# Patient Record
Sex: Male | Born: 1950 | Race: White | Hispanic: No | Marital: Married | Smoking: Former smoker
Health system: Southern US, Community
[De-identification: ages and names within clinical notes are randomized; demographics above are authoritative.]

## PROBLEM LIST (undated history)

## (undated) DIAGNOSIS — M1990 Unspecified osteoarthritis, unspecified site: Secondary | ICD-10-CM

## (undated) DIAGNOSIS — I10 Essential (primary) hypertension: Secondary | ICD-10-CM

## (undated) DIAGNOSIS — E119 Type 2 diabetes mellitus without complications: Secondary | ICD-10-CM

## (undated) DIAGNOSIS — H269 Unspecified cataract: Secondary | ICD-10-CM

## (undated) DIAGNOSIS — E785 Hyperlipidemia, unspecified: Secondary | ICD-10-CM

## (undated) DIAGNOSIS — T7840XA Allergy, unspecified, initial encounter: Secondary | ICD-10-CM

## (undated) DIAGNOSIS — C801 Malignant (primary) neoplasm, unspecified: Secondary | ICD-10-CM

## (undated) HISTORY — DX: Hyperlipidemia, unspecified: E78.5

## (undated) HISTORY — DX: Malignant (primary) neoplasm, unspecified: C80.1

## (undated) HISTORY — DX: Unspecified cataract: H26.9

## (undated) HISTORY — DX: Unspecified osteoarthritis, unspecified site: M19.90

## (undated) HISTORY — DX: Essential (primary) hypertension: I10

## (undated) HISTORY — DX: Type 2 diabetes mellitus without complications: E11.9

## (undated) HISTORY — DX: Allergy, unspecified, initial encounter: T78.40XA

---

## 1973-03-05 HISTORY — PX: OTHER SURGICAL HISTORY: SHX169

## 2009-11-26 ENCOUNTER — Ambulatory Visit: Payer: Self-pay | Admitting: Diagnostic Radiology

## 2009-11-26 ENCOUNTER — Ambulatory Visit (HOSPITAL_BASED_OUTPATIENT_CLINIC_OR_DEPARTMENT_OTHER): Admission: RE | Admit: 2009-11-26 | Discharge: 2009-11-26 | Payer: Self-pay | Admitting: Family Medicine

## 2012-08-04 ENCOUNTER — Encounter: Payer: Self-pay | Admitting: Gastroenterology

## 2012-09-19 ENCOUNTER — Encounter: Payer: Self-pay | Admitting: Gastroenterology

## 2012-10-24 ENCOUNTER — Encounter: Payer: Self-pay | Admitting: Gastroenterology

## 2012-11-05 ENCOUNTER — Encounter: Payer: Self-pay | Admitting: Gastroenterology

## 2012-12-15 ENCOUNTER — Ambulatory Visit (AMBULATORY_SURGERY_CENTER): Payer: Self-pay | Admitting: *Deleted

## 2012-12-15 VITALS — Ht 68.0 in | Wt 221.0 lb

## 2012-12-15 DIAGNOSIS — Z1211 Encounter for screening for malignant neoplasm of colon: Secondary | ICD-10-CM

## 2012-12-15 NOTE — Progress Notes (Signed)
No allergies to eggs or soy. No problems with anesthesia.  

## 2012-12-17 ENCOUNTER — Encounter: Payer: Self-pay | Admitting: Gastroenterology

## 2012-12-19 ENCOUNTER — Ambulatory Visit (AMBULATORY_SURGERY_CENTER): Payer: BC Managed Care – PPO | Admitting: Gastroenterology

## 2012-12-19 ENCOUNTER — Encounter: Payer: Self-pay | Admitting: Gastroenterology

## 2012-12-19 VITALS — BP 139/85 | HR 69 | Temp 97.9°F | Resp 21 | Ht 68.0 in | Wt 221.0 lb

## 2012-12-19 DIAGNOSIS — D126 Benign neoplasm of colon, unspecified: Secondary | ICD-10-CM

## 2012-12-19 DIAGNOSIS — Z1211 Encounter for screening for malignant neoplasm of colon: Secondary | ICD-10-CM

## 2012-12-19 MED ORDER — SODIUM CHLORIDE 0.9 % IV SOLN: 500.0000 mL | INTRAVENOUS | Status: DC

## 2012-12-19 NOTE — Progress Notes (Signed)
Procedure ends, to recovery, report given and VSS. 

## 2012-12-19 NOTE — Progress Notes (Signed)
Patient did not have preoperative order for IV antibiotic SSI prophylaxis. (G8918)  Patient did not experience any of the following events: a burn prior to discharge; a fall within the facility; wrong site/side/patient/procedure/implant event; or a hospital transfer or hospital admission upon discharge from the facility. (G8907)  

## 2012-12-19 NOTE — Op Note (Signed)
[facility] [address] [place], [address]   COLONOSCOPY PROCEDURE REPORT PATIENT: Ricardo Hahn, Ricardo Hahn  MR#: [id number] BIRTHDATE: [birth year] , 62  yrs. old GENDER: Male ENDOSCOPIST: Meryl Dare, MD, [facility] PROCEDURE DATE:  12/19/2012 PROCEDURE:   Colonoscopy with snare polypectomy First Screening Colonoscopy - Avg.  risk and is 50 yrs.  old or older Yes.  Prior Negative Screening - Now for repeat screening. N/A  History of Adenoma - Now for follow-up colonoscopy & has been > or = to 3 yrs.  N/A  Polyps Removed Today? Yes. ASA CLASS:   Class II INDICATIONS:average risk screening. MEDICATIONS: MAC sedation, administered by CRNA and propofol (Diprivan) 250mg  IV DESCRIPTION OF PROCEDURE:   After the risks benefits and alternatives of the procedure were thoroughly explained, informed consent was obtained.  A digital rectal exam revealed no abnormalities of the rectum.   The LB [id number]  endoscope was introduced through the anus and advanced to the cecum, which was identified by both the appendix and ileocecal valve. No adverse events experienced.   The quality of the prep was good, using MoviPrep  The instrument was then slowly withdrawn as the colon was fully examined.  COLON FINDINGS: Three sessile polyps measuring 4, 5, 7 mm in size were found in the ascending colon.  A polypectomy was performed with a cold snare.  The resection was complete and the polyp tissue was completely retrieved.   A sessile polyp measuring 1 cm in size was found in the transverse colon.  A polypectomy was performed using snare cautery.  The resection was complete and the polyp tissue was completely retrieved. Moderate diverticulosis was noted in the descending colon and sigmoid colon.   The colon was otherwise normal.  There was no diverticulosis, inflammation, polyps or cancers unless previously stated.  Retroflexed views revealed no abnormalities. The time to cecum=1 minutes  45 seconds. Withdrawal time=13 minutes 15 seconds.  The scope was withdrawn and the procedure completed. COMPLICATIONS: There were no complications. ENDOSCOPIC IMPRESSION: 1.   Three sessile polyps measuring 4, 5, 7 mm in the ascending colon; polypectomy performed with a cold snare 2.   Sessile polyp measuring 1 cm in the transverse colon; polypectomy performed using snare cautery 3.   Moderate diverticulosis was noted in the descending colon and sigmoid colon  RECOMMENDATIONS: 1.  Hold aspirin, aspirin products, and anti-inflammatory medication for 2 weeks. 2.  Await pathology results 3.  Repeat colonoscopy in 3 years if largest polyp adenomatous or 3 or more adenomatous; 5 years if 1-2 adenomatous; otherwise 10 years 4.  High fiber diet with liberal fluid intake.  eSigned:  Meryl Dare, MD, [facility] 12/19/2012 8:28 AM

## 2012-12-19 NOTE — Progress Notes (Signed)
Called to room to assist during endoscopic procedure.  Patient ID and intended procedure confirmed with present staff. Received instructions for my participation in the procedure from the performing physician.  

## 2012-12-19 NOTE — Patient Instructions (Signed)
Impressions/recommendations:  Polyps (handout given) Diverticulosis (handout given)  High Fiber diet (handout given)  Hold aspirin, aspirin containing products and anti-inflammatory medicines for two weeks. May resume November 1.  Repeat colonoscopy pending pathology results.  YOU HAD AN ENDOSCOPIC PROCEDURE TODAY AT THE [facility]: Refer to the procedure report that was given to you for any specific questions about what was found during the examination.  If the procedure report does not answer your questions, please call your gastroenterologist to clarify.  If you requested that your care partner not be given the details of your procedure findings, then the procedure report has been included in a sealed envelope for you to review at your convenience later.  YOU SHOULD EXPECT: Some feelings of bloating in the abdomen. Passage of more gas than usual.  Walking can help get rid of the air that was put into your GI tract during the procedure and reduce the bloating. If you had a lower endoscopy (such as a colonoscopy or flexible sigmoidoscopy) you may notice spotting of blood in your stool or on the toilet paper. If you underwent a bowel prep for your procedure, then you may not have a normal bowel movement for a few days.  DIET: Your first meal following the procedure should be a light meal and then it is ok to progress to your normal diet.  A half-sandwich or bowl of soup is an example of a good first meal.  Heavy or fried foods are harder to digest and may make you feel nauseous or bloated.  Likewise meals heavy in dairy and vegetables can cause extra gas to form and this can also increase the bloating.  Drink plenty of fluids but you should avoid alcoholic beverages for 24 hours.  ACTIVITY: Your care partner should take you home directly after the procedure.  You should plan to take it easy, moving slowly for the rest of the day.  You can resume normal activity the day after the  procedure however you should NOT DRIVE or use heavy machinery for 24 hours (because of the sedation medicines used during the test).    SYMPTOMS TO REPORT IMMEDIATELY: A gastroenterologist can be reached at any hour.  During normal business hours, 8:30 AM to 5:00 PM Monday through Friday, call [phone].  After hours and on weekends, please call the GI answering service at [phone] who will take a message and have the physician on call contact you.   Following lower endoscopy (colonoscopy or flexible sigmoidoscopy):  Excessive amounts of blood in the stool  Significant tenderness or worsening of abdominal pains  Swelling of the abdomen that is new, acute  Fever of 100F or higher   FOLLOW UP: If any biopsies were taken you will be contacted by phone or by letter within the next 1-3 weeks.  Call your gastroenterologist if you have not heard about the biopsies in 3 weeks.  Our staff will call the home number listed on your records the next business day following your procedure to check on you and address any questions or concerns that you may have at that time regarding the information given to you following your procedure. This is a courtesy call and so if there is no answer at the home number and we have not heard from you through the emergency physician on call, we will assume that you have returned to your regular daily activities without incident.  SIGNATURES/CONFIDENTIALITY: You and/or your care partner have signed paperwork which will  be entered into your electronic medical record.  These signatures attest to the fact that that the information above on your After Visit Summary has been reviewed and is understood.  Full responsibility of the confidentiality of this discharge information lies with you and/or your care-partner. 

## 2012-12-22 ENCOUNTER — Telehealth: Payer: Self-pay | Admitting: *Deleted

## 2012-12-22 NOTE — Telephone Encounter (Signed)
  Follow up Call-  Call back number 12/19/2012  Post procedure Call Back phone  # [phone]  Permission to leave phone message Yes    No answer and pt doesn't have a voicemail box set up yet

## 2012-12-23 ENCOUNTER — Encounter: Payer: Self-pay | Admitting: Gastroenterology

## 2016-03-14 ENCOUNTER — Other Ambulatory Visit: Payer: Self-pay

## 2016-03-14 DIAGNOSIS — Z86010 Personal history of colonic polyps: Secondary | ICD-10-CM

## 2016-03-30 ENCOUNTER — Ambulatory Visit (AMBULATORY_SURGERY_CENTER): Payer: BLUE CROSS/BLUE SHIELD | Admitting: Gastroenterology

## 2016-03-30 ENCOUNTER — Encounter: Payer: Self-pay | Admitting: Gastroenterology

## 2016-03-30 VITALS — BP 138/80 | HR 78 | Temp 98.1°F | Resp 19 | Wt 207.5 lb

## 2016-03-30 DIAGNOSIS — D125 Benign neoplasm of sigmoid colon: Secondary | ICD-10-CM | POA: Diagnosis not present

## 2016-03-30 DIAGNOSIS — Z800 Family history of malignant neoplasm of digestive organs: Secondary | ICD-10-CM

## 2016-03-30 DIAGNOSIS — Z86010 Personal history of colonic polyps: Secondary | ICD-10-CM | POA: Diagnosis not present

## 2016-03-30 DIAGNOSIS — D124 Benign neoplasm of descending colon: Secondary | ICD-10-CM | POA: Diagnosis not present

## 2016-03-30 DIAGNOSIS — K635 Polyp of colon: Secondary | ICD-10-CM

## 2016-03-30 DIAGNOSIS — D123 Benign neoplasm of transverse colon: Secondary | ICD-10-CM

## 2016-03-30 MED ORDER — SODIUM CHLORIDE 0.9 % IV SOLN: 500.0000 mL | INTRAVENOUS | Status: DC

## 2016-03-30 NOTE — Op Note (Addendum)
[facility] Patient Name: Ricardo Hahn Procedure Date: 03/30/2016 1:54 PM MRN: [id number] Endoscopist: Ladene Artist , MD Age: 66 Referring MD:  Date of Birth: 08/13/1950 Gender: Male Account #: [id number] Procedure:                Colonoscopy Indications:              Surveillance: Personal history of adenomatous                            polyps on last colonoscopy > 3 years ago, Screening                            patient at increased risk: Family history of                            colorectal cancer in multiple 1st-degree relatives Medicines:                Monitored Anesthesia Care Procedure:                Pre-Anesthesia Assessment:                           - Prior to the procedure, a History and Physical                            was performed, and patient medications and                            allergies were reviewed. The patient's tolerance of                            previous anesthesia was also reviewed. The risks                            and benefits of the procedure and the sedation                            options and risks were discussed with the patient.                            All questions were answered, and informed consent                            was obtained. Prior Anticoagulants: The patient has                            taken no previous anticoagulant or antiplatelet                            agents. ASA Grade Assessment: II - A patient with                            mild systemic disease. After reviewing the risks  and benefits, the patient was deemed in                            satisfactory condition to undergo the procedure.                           After obtaining informed consent, the colonoscope                            was passed under direct vision. Throughout the                            procedure, the patient's blood pressure, pulse, and                            oxygen  saturations were monitored continuously. The                            Model [id number]-[id number]) scope was introduced                            through the anus and advanced to the the cecum,                            identified by appendiceal orifice and ileocecal                            valve. The ileocecal valve, appendiceal orifice,                            and rectum were photographed. The quality of the                            bowel preparation was good. The colonoscopy was                            performed without difficulty. The patient tolerated                            the procedure well. Scope In: 2:04:30 PM Scope Out: 2:20:35 PM Scope Withdrawal Time: 0 hours 13 minutes 59 seconds  Total Procedure Duration: 0 hours 16 minutes 5 seconds  Findings:                 The perianal and digital rectal examinations were                            normal.                           Three sessile polyps were found in the descending                            colon, transverse colon and hepatic flexure. The  polyps were 6 to 8 mm in size. These polyps were                            removed with a cold snare. Resection and retrieval                            were complete.                           Two sessile polyps were found in the sigmoid colon                            and descending colon. The polyps were 5 mm in size.                            These polyps were removed with a cold biopsy                            forceps. Resection and retrieval were complete.                           Multiple medium-mouthed diverticula were found in                            the sigmoid colon and descending colon. There was                            narrowing of the colon in association with the                            diverticular opening. There was evidence of                            diverticular spasm. There was no evidence of                             diverticular bleeding.                           The exam was otherwise without abnormality on                            direct and retroflexion views. Complications:            No immediate complications. Estimated blood loss:                            None. Estimated Blood Loss:     Estimated blood loss: none. Impression:               - Three 6 to 8 mm polyps in the descending colon,                            in the transverse colon and at the hepatic flexure,  removed with a cold snare. Resected and retrieved.                           - Two 5 mm polyps in the sigmoid colon and in the                            descending colon, removed with a cold biopsy                            forceps. Resected and retrieved.                           - Moderate diverticulosis in the sigmoid colon and                            in the descending colon.                           - The examination was otherwise normal on direct                            and retroflexion views. Recommendation:           - Repeat colonoscopy in 3 years for surveillance.                           - Patient has a contact number available for                            emergencies. The signs and symptoms of potential                            delayed complications were discussed with the                            patient. Return to normal activities tomorrow.                            Written discharge instructions were provided to the                            patient.                           - High fiber diet.                           - Continue present medications.                           - Await pathology results. Ladene Artist, MD 03/30/2016 2:28:18 PM This report has been signed electronically.

## 2016-03-30 NOTE — Progress Notes (Signed)
Called to room to assist during endoscopic procedure.  Patient ID and intended procedure confirmed with present staff. Received instructions for my participation in the procedure from the performing physician.  

## 2016-03-30 NOTE — Patient Instructions (Signed)
YOU HAD AN ENDOSCOPIC PROCEDURE TODAY AT [facility]:   Refer to the procedure report that was given to you for any specific questions about what was found during the examination.  If the procedure report does not answer your questions, please call your gastroenterologist to clarify.  If you requested that your care partner not be given the details of your procedure findings, then the procedure report has been included in a sealed envelope for you to review at your convenience later.  YOU SHOULD EXPECT: Some feelings of bloating in the abdomen. Passage of more gas than usual.  Walking can help get rid of the air that was put into your GI tract during the procedure and reduce the bloating. If you had a lower endoscopy (such as a colonoscopy or flexible sigmoidoscopy) you may notice spotting of blood in your stool or on the toilet paper. If you underwent a bowel prep for your procedure, you may not have a normal bowel movement for a few days.  Please Note:  You might notice some irritation and congestion in your nose or some drainage.  This is from the oxygen used during your procedure.  There is no need for concern and it should clear up in a day or so.  SYMPTOMS TO REPORT IMMEDIATELY:   Following lower endoscopy (colonoscopy or flexible sigmoidoscopy):  Excessive amounts of blood in the stool  Significant tenderness or worsening of abdominal pains  Swelling of the abdomen that is new, acute  Fever of 100F or higher  For urgent or emergent issues, a gastroenterologist can be reached at any hour by calling [phone].   DIET:  We do recommend a small meal at first, but then you may proceed to your regular diet.  Drink plenty of fluids but you should avoid alcoholic beverages for 24 hours.  ACTIVITY:  You should plan to take it easy for the rest of today and you should NOT DRIVE or use heavy machinery until tomorrow (because of the sedation medicines used during the test).     FOLLOW UP: Our staff will call the number listed on your records the next business day following your procedure to check on you and address any questions or concerns that you may have regarding the information given to you following your procedure. If we do not reach you, we will leave a message.  However, if you are feeling well and you are not experiencing any problems, there is no need to return our call.  We will assume that you have returned to your regular daily activities without incident.  If any biopsies were taken you will be contacted by phone or by letter within the next 1-3 weeks.  Please call us at [phone] if you have not heard about the biopsies in 3 weeks.   Repeat colonoscopy in 3 years Await for biopsy results  Polyps (handout given) High Fiber Diet (handout given) Diverticulosis (handout given)   SIGNATURES/CONFIDENTIALITY: You and/or your care partner have signed paperwork which will be entered into your electronic medical record.  These signatures attest to the fact that that the information above on your After Visit Summary has been reviewed and is understood.  Full responsibility of the confidentiality of this discharge information lies with you and/or your care-partner.

## 2016-03-30 NOTE — Progress Notes (Signed)
To recovery awake alert vss report to [employer]

## 2016-04-02 ENCOUNTER — Telehealth: Payer: Self-pay

## 2016-04-02 NOTE — Telephone Encounter (Signed)
Attempted to reach pt. For follow up call following endoscopic procedure.   LM on wife's ans. Machine as directed.

## 2016-04-02 NOTE — Telephone Encounter (Signed)
Pt.'s wife returned call from phone message earlier this morning.  Reports pt. Is doing fine following procedure, and has returned to his normal diet and activities.

## 2016-04-06 ENCOUNTER — Encounter: Payer: Self-pay | Admitting: Gastroenterology

## 2016-04-27 ENCOUNTER — Encounter: Payer: Self-pay | Admitting: Gastroenterology

## 2016-08-21 ENCOUNTER — Telehealth: Payer: Self-pay

## 2016-08-21 MED ORDER — CIPROFLOXACIN HCL 500 MG PO TABS: 500.0000 mg | ORAL_TABLET | Freq: Two times a day (BID) | ORAL | 0 refills | Status: DC

## 2016-08-21 MED ORDER — METRONIDAZOLE 500 MG PO TABS: 500.0000 mg | ORAL_TABLET | Freq: Two times a day (BID) | ORAL | 0 refills | Status: DC

## 2016-08-21 NOTE — Telephone Encounter (Signed)
Patient's wife notified of the recommendations.

## 2016-08-21 NOTE — Telephone Encounter (Signed)
Patient's wife reports LLQ abdominal pain.  Pain is sharp.  Denies blood in stool, fevers.  Patient with a history of diverticulosis. Pain started 2 days ago.

## 2016-08-21 NOTE — Telephone Encounter (Signed)
OK..soft diet until feeling better, Tylenol as needed for pain, start Cipro 500 mg po BID x 10 days and Flagyl 500 mg po BID x 10 days, No ETOH while on Flagyl.  He will need to be seen if pain worsens or if he fails to improve in next couple days .

## 2016-12-18 ENCOUNTER — Ambulatory Visit: Payer: Self-pay

## 2016-12-18 ENCOUNTER — Ambulatory Visit (INDEPENDENT_AMBULATORY_CARE_PROVIDER_SITE_OTHER): Payer: BLUE CROSS/BLUE SHIELD | Admitting: Sports Medicine

## 2016-12-18 ENCOUNTER — Ambulatory Visit (INDEPENDENT_AMBULATORY_CARE_PROVIDER_SITE_OTHER): Payer: BLUE CROSS/BLUE SHIELD

## 2016-12-18 ENCOUNTER — Telehealth: Payer: Self-pay | Admitting: Sports Medicine

## 2016-12-18 VITALS — BP 160/90 | HR 95 | Ht 68.0 in | Wt 218.6 lb

## 2016-12-18 DIAGNOSIS — S9032XA Contusion of left foot, initial encounter: Secondary | ICD-10-CM | POA: Diagnosis not present

## 2016-12-18 DIAGNOSIS — M79672 Pain in left foot: Secondary | ICD-10-CM | POA: Diagnosis not present

## 2016-12-18 DIAGNOSIS — M19072 Primary osteoarthritis, left ankle and foot: Secondary | ICD-10-CM | POA: Diagnosis not present

## 2016-12-18 MED ORDER — NAPROXEN-ESOMEPRAZOLE 500-20 MG PO TBEC: 1.0000 | DELAYED_RELEASE_TABLET | Freq: Two times a day (BID) | ORAL | 2 refills | Status: DC

## 2016-12-18 MED ORDER — NAPROXEN-ESOMEPRAZOLE 500-20 MG PO TBEC: 1.0000 | DELAYED_RELEASE_TABLET | Freq: Two times a day (BID) | ORAL | 0 refills | Status: AC

## 2016-12-18 NOTE — Telephone Encounter (Signed)
[facility] called to advise that the patient was last seen in 2011 and is no longer their patient so, they do not have records.

## 2016-12-18 NOTE — Telephone Encounter (Signed)
Noted  

## 2016-12-18 NOTE — Progress Notes (Signed)
OFFICE VISIT NOTE Ricardo Hahn. Ricardo Hahn, [facility] [place] - 66 y.o. male MRN [id number]  Date of birth: 1950-05-25  Visit Date: 12/18/2016  PCP: Corine Shelter, PA-C   Referred by: Corine Shelter, PA-C  Thalia Bloodgood PT, LAT, ATC acting as scribe for Dr. Paulla Fore.  SUBJECTIVE:   Chief Complaint  Patient presents with  . New Patient (Initial Visit)    L foot pain   HPI: As below and per problem based documentation when appropriate.  Mr. Ricardo Hahn is a new pt presenting today w/ c/o L lateral foot pain and swelling.  Pt states that he was deer hunting last weekend.  He stepped into a hole and caught the front of his foot on the edge of the hole.  He states that his L foot wasn't bothering him much the rest of that day but was more painful the next day (last Sunday) and was swollen starting yesterday morning (Monday).  He states that he iced his L foot 3-4x yesterday and a couple times today.  He notes that this morning his L foot was less swollen than the day before but more painful.  He took some Aleve today and also took a hot bath which he said has helped his pain.  Pt states that weight bearing makes his symptoms worse.  He rates the pain as a sharp, 4-5/10 currently and an 8/10 at it's worst.  Pt states that he has a hx of a R lateral ankle sprain but does not recall any injuries to his L foot or ankle.  Pt also denies any N/T into his L foot/toes.     Review of Systems  Constitutional: Negative for chills, fever and weight loss.  HENT: Negative.   Eyes: Negative.   Respiratory: Negative for cough, shortness of breath and wheezing.   Cardiovascular: Negative for chest pain and palpitations.  Gastrointestinal: Negative for abdominal pain, heartburn and nausea.  Genitourinary: Negative.   Musculoskeletal: Positive for joint pain. Negative for falls.  Neurological: Negative for dizziness, tingling and headaches.    Endo/Heme/Allergies: Does not bruise/bleed easily.  Psychiatric/Behavioral: Negative for depression. The patient is not nervous/anxious and does not have insomnia.     Otherwise per HPI.  HISTORY & PERTINENT PRIOR DATA:  No specialty comments available. He reports that he quit smoking about 43 years ago. His smoking use included Cigarettes. He uses smokeless tobacco. No results for input(s): HGBA1C, LABURIC in the last 8760 hours. Allergies reviewed per EMR Prior to Admission medications   Medication Sig Start Date End Date Taking? Authorizing Provider  Multiple Vitamin (MULTIVITAMIN) tablet Take 1 tablet by mouth daily.    [provider]  Naproxen-Esomeprazole (VIMOVO) 500-20 MG TBEC Take 1 tablet by mouth 2 (two) times daily. 12/18/16 12/19/16  Gerda Diss, DO  Naproxen-Esomeprazole (VIMOVO) 500-20 MG TBEC Take 1 tablet by mouth 2 (two) times daily. 12/18/16   Gerda Diss, DO   Patient Active Problem List   Diagnosis Date Noted  . Contusion of foot, left 12/18/2016   Past Medical History:  Diagnosis Date  . Allergy    seasonal  . Arthritis    Family History  Problem Relation Age of Onset  . Colon cancer Father 15  . Colon cancer Brother 65  . Colon cancer Paternal Grandfather 62   Past Surgical History:  Procedure Laterality Date  . ingunial hernia repair Left 1975   Social History  Occupational History  . Not on file.   Social History Main Topics  . Smoking status: Former Smoker    Types: Cigarettes    Quit date: 12/15/1973  . Smokeless tobacco: Current User  . Alcohol use No  . Drug use: No  . Sexual activity: Not on file    OBJECTIVE:  VS:  HT:5\' 8"  (172.7 cm)   WT:218 lb 9.6 oz (99.2 kg)  BMI:33.25    BP:(!) 160/90  HR:95bpm  TEMP: ( )  RESP:97 % EXAM: Findings:  WDWN, NAD, Non-toxic appearing Alert & appropriately interactive Not depressed or anxious appearing No increased work of breathing. Pupils are equal. EOM intact  without nystagmus No clubbing or cyanosis of the extremities appreciated No significant skin lesions.  Does have a small amount of erythema over the anterior lateral foot  DP & PT pulses 2+/4.  No significant pretibial edema. Sensation intact to light touch in lower extremities.  Left foot: Well aligned but anterior lateral swelling with slight erythema.  Focally tender over the base of the fourth metatarsal.  No focal pain over the ankle joint, anterior or posterior aspects of the medial or lateral malleolus, ankle drawer testing is normal.  He has pain with forefoot abduction.  No pain along the medial aspect of the tarsometatarsal junction.  No pain with first or second toe dorsiflexion/plantarflexion.  He has pain with fulcrum testing of the fourth metatarsal.  Dorsiflexion, plantar flexion, inversion and eversion strength is 5 out of 5.  No pain along the posterior tibialis, anterior tibialis or peroneal tendons.    RADIOLOGY: Three-view x-ray of the left foot is overall well aligned.  No obvious fracture.  Small amount of widening of the tarsometatarsal junction of the lateral column but this is minimal.  Medial column is normally aligned. ASSESSMENT & PLAN:     ICD-10-CM   1. Left foot pain M79.672 DG Foot Complete Left    [place] LIMITED JOINT SPACE STRUCTURES LOW LEFT(NO LINKED CHARGES)    Naproxen-Esomeprazole (VIMOVO) 500-20 MG TBEC  2. Contusion of left foot, initial encounter S90.32XA    ================================================================= Contusion of foot, left Exact diagnosis unclear but marked soft tissue edema with possible cortical irregularity of the base of the fourth metatarsal consistent with either bony contusion or nondisplaced fracture.  X-rays were negative.  We will plan to have him use foot immobilization with a postoperative shoe and compression with an Ace wrap at this time.  We will have him begin with Vimovo twice daily and follow-up in 2 weeks to ensure  clinical resolution.  If persistent symptoms will likely need continued immobilization. Plan to repeat ultrasound at follow-up.  LIMITED MSK ULTRASOUND OF left foot Images were obtained and interpreted by myself, Teresa Coombs, DO  Images have been saved and stored to PACS system. Images obtained on: GE S7 Ultrasound machine  FINDINGS:   Marked soft tissue edema over the anterior lateral foot.  Base of the fifth metatarsal is normal appearing.  Base of the fourth metatarsal does have a slight cortical irregularity with marked increased neovascularity and soft tissue edema adjacent to this area.  No displacement.  IMPRESSION:  1. Soft tissue edema and possible nondisplaced fracture of the base of the fourth metatarsal.  ================================================================= There are no Patient Instructions on file for this visit. ================================================================= Future Appointments Date Time Provider [place]  01/03/2017 8:20 AM Gerda Diss, DO [facility] None    Follow-up: Return in about 2 weeks (around 01/01/2017).   CMA/ATC  served as Education administrator during this visit. History, Physical, and Plan performed by medical provider. Documentation and orders reviewed and attested to.      Teresa Coombs, [place] Physician

## 2016-12-18 NOTE — Assessment & Plan Note (Signed)
Exact diagnosis unclear but marked soft tissue edema with possible cortical irregularity of the base of the fourth metatarsal consistent with either bony contusion or nondisplaced fracture.  X-rays were negative.  We will plan to have him use foot immobilization with a postoperative shoe and compression with an Ace wrap at this time.  We will have him begin with Vimovo twice daily and follow-up in 2 weeks to ensure clinical resolution.  If persistent symptoms will likely need continued immobilization. Plan to repeat ultrasound at follow-up.

## 2016-12-18 NOTE — Procedures (Signed)
LIMITED MSK ULTRASOUND OF left foot Images were obtained and interpreted by myself, Teresa Coombs, DO  Images have been saved and stored to PACS system. Images obtained on: GE S7 Ultrasound machine  FINDINGS:   Marked soft tissue edema over the anterior lateral foot.  Base of the fifth metatarsal is normal appearing.  Base of the fourth metatarsal does have a slight cortical irregularity with marked increased neovascularity and soft tissue edema adjacent to this area.  No displacement.  IMPRESSION:  1. Soft tissue edema and possible nondisplaced fracture of the base of the fourth metatarsal.

## 2017-01-03 ENCOUNTER — Ambulatory Visit: Payer: BLUE CROSS/BLUE SHIELD | Admitting: Sports Medicine

## 2017-07-09 ENCOUNTER — Telehealth: Payer: Self-pay

## 2017-07-09 ENCOUNTER — Other Ambulatory Visit: Payer: Self-pay

## 2017-07-09 MED ORDER — CIPROFLOXACIN HCL 500 MG PO TABS: 500.0000 mg | ORAL_TABLET | Freq: Two times a day (BID) | ORAL | 0 refills | Status: DC

## 2017-07-09 NOTE — Telephone Encounter (Signed)
-----   Message from Alfredia Ferguson, PA-C sent at 07/09/2017  1:50 PM EDT ----- Regarding: Bryna Colander - this is Pam's husband - pt of Dr Fuller Plan - hx of recurrent diverticulitis - having flare with onset of LLQ pain last pm  Please send RX for Cipro 500 mg po BID x 10 day to [facility]  For pick up  Today Thank you .  He has Flagy  At home 500 mg and will take that BID as well

## 2017-07-09 NOTE — Telephone Encounter (Signed)
Medications sent to the pharmacy.

## 2017-07-10 MED FILL — CIPROFLOXACIN HCL 500 MG TA: 500 | 10 days supply | Qty: 20 | Fill #0

## 2017-07-19 ENCOUNTER — Ambulatory Visit: Payer: BLUE CROSS/BLUE SHIELD | Admitting: Family Medicine

## 2018-08-19 ENCOUNTER — Telehealth: Payer: Self-pay

## 2018-08-19 MED ORDER — CIPROFLOXACIN HCL 500 MG PO TABS: 500.0000 mg | ORAL_TABLET | Freq: Two times a day (BID) | ORAL | 0 refills | Status: AC

## 2018-08-19 MED ORDER — METRONIDAZOLE 500 MG PO TABS: 500.0000 mg | ORAL_TABLET | Freq: Two times a day (BID) | ORAL | 0 refills | Status: AC

## 2018-08-19 NOTE — Telephone Encounter (Signed)
Clear liquids today and slowly advance to soft diet as he feels better. Tylenol as needed for pain. Cipro 500 mg po BID x 10 d, Flagyl 500 mg po BID x 10 d.  Call us if pain worsens or if he fails to improve in next few days

## 2018-08-19 NOTE — Telephone Encounter (Signed)
I was contacted by patient's wife. Patient reports that he has a hx of diverticulitis.  He is experiencing LLQ pain for the last several days.  He denies fever, rectal bleeding, diarrhea.  He is slightly constipated. There are no APP appts this week. Pain is similar to other episodes of diverticulitis.   Please review and advise.

## 2018-08-19 NOTE — Telephone Encounter (Signed)
Patient's wife notified. She understands to call back if no improvement in symptoms.

## 2019-04-03 ENCOUNTER — Ambulatory Visit: Payer: BLUE CROSS/BLUE SHIELD

## 2019-04-24 ENCOUNTER — Ambulatory Visit: Payer: BLUE CROSS/BLUE SHIELD

## 2019-10-08 ENCOUNTER — Encounter: Payer: Self-pay | Admitting: Gastroenterology

## 2019-12-11 ENCOUNTER — Encounter: Payer: BLUE CROSS/BLUE SHIELD | Admitting: Gastroenterology

## 2020-01-04 ENCOUNTER — Emergency Department (HOSPITAL_BASED_OUTPATIENT_CLINIC_OR_DEPARTMENT_OTHER)
Admission: EM | Admit: 2020-01-04 | Discharge: 2020-01-05 | Disposition: A | Discharge status: Home / Self Care | Payer: Medicare HMO | Attending: Emergency Medicine | Admitting: Emergency Medicine

## 2020-01-04 ENCOUNTER — Other Ambulatory Visit: Payer: Self-pay

## 2020-01-04 ENCOUNTER — Emergency Department (HOSPITAL_BASED_OUTPATIENT_CLINIC_OR_DEPARTMENT_OTHER): Payer: Medicare HMO

## 2020-01-04 ENCOUNTER — Encounter (HOSPITAL_BASED_OUTPATIENT_CLINIC_OR_DEPARTMENT_OTHER): Payer: Self-pay | Admitting: *Deleted

## 2020-01-04 DIAGNOSIS — J209 Acute bronchitis, unspecified: Secondary | ICD-10-CM

## 2020-01-04 DIAGNOSIS — Z20822 Contact with and (suspected) exposure to covid-19: Secondary | ICD-10-CM | POA: Diagnosis not present

## 2020-01-04 DIAGNOSIS — R059 Cough, unspecified: Secondary | ICD-10-CM | POA: Insufficient documentation

## 2020-01-04 DIAGNOSIS — R000 Tachycardia, unspecified: Secondary | ICD-10-CM | POA: Insufficient documentation

## 2020-01-04 DIAGNOSIS — R0602 Shortness of breath: Secondary | ICD-10-CM | POA: Insufficient documentation

## 2020-01-04 DIAGNOSIS — R062 Wheezing: Secondary | ICD-10-CM | POA: Diagnosis not present

## 2020-01-04 DIAGNOSIS — Z87891 Personal history of nicotine dependence: Secondary | ICD-10-CM | POA: Insufficient documentation

## 2020-01-04 LAB — CBC WITH DIFFERENTIAL/PLATELET
Abs Immature Granulocytes: 0.03 10*3/uL (ref 0.00–0.07)
Basophils Absolute: 0 10*3/uL (ref 0.0–0.1)
Basophils Relative: 0 %
Eosinophils Absolute: 0.2 10*3/uL (ref 0.0–0.5)
Eosinophils Relative: 2 %
HCT: 44.8 % (ref 39.0–52.0)
Hemoglobin: 14.7 g/dL (ref 13.0–17.0)
Immature Granulocytes: 0 %
Lymphocytes Relative: 10 %
Lymphs Abs: 1 10*3/uL (ref 0.7–4.0)
MCH: 29.5 pg (ref 26.0–34.0)
MCHC: 32.8 g/dL (ref 30.0–36.0)
MCV: 89.8 fL (ref 80.0–100.0)
Monocytes Absolute: 1 10*3/uL (ref 0.1–1.0)
Monocytes Relative: 10 %
Neutro Abs: 7.9 10*3/uL — ABNORMAL HIGH (ref 1.7–7.7)
Neutrophils Relative %: 78 %
Platelets: 204 10*3/uL (ref 150–400)
RBC: 4.99 MIL/uL (ref 4.22–5.81)
RDW: 14.5 % (ref 11.5–15.5)
WBC: 10.2 10*3/uL (ref 4.0–10.5)
nRBC: 0 % (ref 0.0–0.2)

## 2020-01-04 LAB — BRAIN NATRIURETIC PEPTIDE: B Natriuretic Peptide: 85.8 pg/mL (ref 0.0–100.0)

## 2020-01-04 LAB — BASIC METABOLIC PANEL
Anion gap: 12 (ref 5–15)
BUN: 11 mg/dL (ref 8–23)
CO2: 24 mmol/L (ref 22–32)
Calcium: 8.4 mg/dL — ABNORMAL LOW (ref 8.9–10.3)
Chloride: 103 mmol/L (ref 98–111)
Creatinine, Ser: 0.94 mg/dL (ref 0.61–1.24)
GFR, Estimated: >60 mL/min (ref >60)
Glucose, Bld: 142 mg/dL — ABNORMAL HIGH (ref 70–99)
Potassium: 4.2 mmol/L (ref 3.5–5.1)
Sodium: 139 mmol/L (ref 135–145)

## 2020-01-04 LAB — RESPIRATORY PANEL BY RT PCR (FLU A&B, COVID)
Influenza A by PCR: NEGATIVE
Influenza B by PCR: NEGATIVE
SARS Coronavirus 2 by RT PCR: NEGATIVE

## 2020-01-04 LAB — TROPONIN I (HIGH SENSITIVITY): Troponin I (High Sensitivity): 16 ng/L (ref <18)

## 2020-01-04 MED ORDER — METHYLPREDNISOLONE SODIUM SUCC 125 MG IJ SOLR
125.0000 mg | Freq: Once | INTRAMUSCULAR | Status: AC
  Administered 2020-01-04: 125 mg via INTRAVENOUS
  Filled 2020-01-04: qty 2

## 2020-01-04 MED ORDER — LABETALOL HCL 5 MG/ML IV SOLN: 10.0000 mg | Freq: Once | INTRAVENOUS | Status: DC

## 2020-01-04 MED ORDER — IPRATROPIUM-ALBUTEROL 0.5-2.5 (3) MG/3ML IN SOLN
3.0000 mL | Freq: Once | RESPIRATORY_TRACT | Status: AC
  Administered 2020-01-04: 3 mL via RESPIRATORY_TRACT
  Filled 2020-01-04: qty 3

## 2020-01-04 MED ORDER — ALBUTEROL SULFATE HFA 108 (90 BASE) MCG/ACT IN AERS
4.0000 | INHALATION_SPRAY | Freq: Once | RESPIRATORY_TRACT | Status: AC
  Administered 2020-01-04: 4 via RESPIRATORY_TRACT
  Filled 2020-01-04: qty 6.7

## 2020-01-04 NOTE — ED Triage Notes (Signed)
Sob x 4 days. His wife has the same symptoms. He had a normal CXR today at [facility].

## 2020-01-04 NOTE — Progress Notes (Signed)
Removed patient from 2 liter nasal cannula.  Patient's SPO2 remains between 92% and 95% and HR remains in the mid 120s.  RT will continue to monitor. '

## 2020-01-04 NOTE — ED Provider Notes (Signed)
[facility] EMERGENCY DEPARTMENT Provider Note   CSN: [id number] Arrival date & time: 01/04/20  2124     History Chief Complaint  Patient presents with  . Shortness of Breath    Ricardo Hahn is a 69 y.o. male.  The history is provided by the patient.  Shortness of Breath  Ricardo Hahn is a 69 y.o. male who presents to the Emergency Department complaining of shortness of breath and cough. He presents the emergency department complaining of shortness of breath and cough that began yesterday. His wife fell ill from what she thought was a fire she cut at work a few days before him getting ill. She is now doing well. He reports cough productive of gray sputum. Denies any fevers, chest pain, abdominal pain, nausea, vomiting, leg swelling or pain. He has no known medical problems and takes no medications. Symptoms are severe and constant nature.    Past Medical History:  Diagnosis Date  . Allergy    seasonal  . Arthritis     Patient Active Problem List   Diagnosis Date Noted  . Contusion of foot, left 12/18/2016    Past Surgical History:  Procedure Laterality Date  . ingunial hernia repair Left 1975       Family History  Problem Relation Age of Onset  . Colon cancer Father 87  . Colon cancer Brother 37  . Colon cancer Paternal Grandfather 38    Social History   Tobacco Use  . Smoking status: Former Smoker    Types: Cigarettes    Quit date: 12/15/1973    Years since quitting: 46.0  . Smokeless tobacco: Current User  Substance Use Topics  . Alcohol use: No  . Drug use: No    Home Medications Prior to Admission medications   Medication Sig Start Date End Date Taking? Authorizing Provider  Multiple Vitamin (MULTIVITAMIN) tablet Take 1 tablet by mouth daily.   Yes [provider]  Naproxen-Esomeprazole (VIMOVO) 500-20 MG TBEC Take 1 tablet by mouth 2 (two) times daily. 12/18/16   Gerda Diss, DO    Allergies    Shellfish  allergy  Review of Systems   Review of Systems  Respiratory: Positive for shortness of breath.   All other systems reviewed and are negative.   Physical Exam Updated Vital Signs BP (!) 164/74   Pulse (!) 34   Temp 98.6 F (37 C) (Oral)   Resp (!) 36   Ht 5\' 8"  (1.727 m)   Wt 99.8 kg   SpO2 97%   BMI 33.45 kg/m   Physical Exam Vitals and nursing note reviewed.  Constitutional:      General: He is in acute distress.     Appearance: He is well-developed. He is ill-appearing.  HENT:     Head: Normocephalic and atraumatic.  Cardiovascular:     Rate and Rhythm: Regular rhythm. Tachycardia present.     Heart sounds: No murmur heard.   Pulmonary:     Effort: Pulmonary effort is normal. No respiratory distress.     Comments: Diffuse wheezing. Frequent coughing. Abdominal:     Palpations: Abdomen is soft.     Tenderness: There is no abdominal tenderness. There is no guarding or rebound.  Musculoskeletal:        General: No swelling or tenderness.  Skin:    General: Skin is warm and dry.  Neurological:     Mental Status: He is alert and oriented to person, place, and time.  Psychiatric:  Behavior: Behavior normal.     ED Results / Procedures / Treatments   Labs (all labs ordered are listed, but only abnormal results are displayed) Labs Reviewed  BASIC METABOLIC PANEL - Abnormal; Notable for the following components:      Result Value   Glucose, Bld 142 (*)    Calcium 8.4 (*)    All other components within normal limits  CBC WITH DIFFERENTIAL/PLATELET - Abnormal; Notable for the following components:   Neutro Abs 7.9 (*)    All other components within normal limits  RESPIRATORY PANEL BY RT PCR (FLU A&B, COVID)  BRAIN NATRIURETIC PEPTIDE  TROPONIN I (HIGH SENSITIVITY)  TROPONIN I (HIGH SENSITIVITY)    EKG EKG Interpretation  Date/Time:  Monday January 04 2020 21:50:31 EDT Ventricular Rate:  122 PR Interval:    QRS Duration: 97 QT Interval:  312 QTC  Calculation: 445 R Axis:   56 Text Interpretation: Sinus tachycardia Multiform ventricular premature complexes Borderline ST depression, diffuse leads No previous tracing Confirmed by Quintella Reichert [phone]) on 01/04/2020 10:47:03 PM   Radiology DG Chest Port 1 View  Result Date: 01/04/2020 CLINICAL DATA:  Shortness of breath X 4 days EXAM: PORTABLE CHEST 1 VIEW COMPARISON:  None. FINDINGS: The heart size and mediastinal contours are within normal limits. Aortic arch calcification. No focal consolidation. No pulmonary edema. No pleural effusion. No pneumothorax. No acute osseous abnormality. IMPRESSION: No active disease. Electronically Signed   By: Iven Finn M.D.   On: 01/04/2020 22:25    Procedures Procedures (including critical care time)  Medications Ordered in ED Medications  ipratropium-albuterol (DUONEB) 0.5-2.5 (3) MG/3ML nebulizer solution 3 mL (has no administration in time range)  albuterol (VENTOLIN HFA) 108 (90 Base) MCG/ACT inhaler 4 puff (4 puffs Inhalation Given 01/04/20 2205)  ipratropium-albuterol (DUONEB) 0.5-2.5 (3) MG/3ML nebulizer solution 3 mL (3 mLs Nebulization Given 01/04/20 2252)  methylPREDNISolone sodium succinate (SOLU-MEDROL) 125 mg/2 mL injection 125 mg (125 mg Intravenous Given 01/04/20 2305)    ED Course  I have reviewed the triage vital signs and the nursing notes.  Pertinent labs & imaging results that were available during my care of the patient were reviewed by me and considered in my medical decision making (see chart for details).    MDM Rules/Calculators/A&P                         patient with no significant medical history here for evaluation of shortness of breath since yesterday. He has been exposed to his wife with a URI. Patient with increased work of breathing on ED arrival and diffuse wheezing. He was treated with albuterol, steroids. On repeat assessment following treatment he is feeling and is clinically improved but does have  persistent wheezing, persistent tachypnea. Imaging is negative for pneumonia. Presentation is not consistent with ACS, PE, dissection. COVID-19 testing is negative. He was significantly hypertensive on ED arrival, blood pressure did improve without any blood pressure intervention, feel hypertension was driven by his difficulty breathing. Overall patient is clinically improving, will provide additional neb treatment. Anticipate discharge home with patient continues to improve. Patient care transferred pending reassessment following neb treatment.  Ricardo Hahn was evaluated in Emergency Department on 01/05/2020 for the symptoms described in the history of present illness. He was evaluated in the context of the global COVID-19 pandemic, which necessitated consideration that the patient might be at risk for infection with the SARS-CoV-2 virus that causes COVID-19. Institutional protocols and algorithms that  pertain to the evaluation of patients at risk for COVID-19 are in a state of rapid change based on information released by regulatory bodies including the CDC and federal and state organizations. These policies and algorithms were followed during the patient's care in the ED.   Final Clinical Impression(s) / ED Diagnoses Final diagnoses:  None    Rx / DC Orders ED Discharge Orders    None       Quintella Reichert, MD 01/05/20 [phone]

## 2020-01-05 DIAGNOSIS — R0602 Shortness of breath: Secondary | ICD-10-CM | POA: Diagnosis not present

## 2020-01-05 MED ORDER — PREDNISONE 10 MG PO TABS: 40.0000 mg | ORAL_TABLET | Freq: Every day | ORAL | 0 refills | Status: DC

## 2020-01-05 MED ORDER — IPRATROPIUM-ALBUTEROL 0.5-2.5 (3) MG/3ML IN SOLN
3.0000 mL | Freq: Once | RESPIRATORY_TRACT | Status: AC
  Administered 2020-01-05: 3 mL via RESPIRATORY_TRACT
  Filled 2020-01-05: qty 3

## 2020-01-05 NOTE — ED Provider Notes (Signed)
Patient in no distress.  Some faint wheezing remains but air movement is good and he is not tachypneic.  He states he feels ready to go home.  He has been given an albuterol inhaler and instructed in its use.     Demontrae Gilbert, Jenny Reichmann, MD 01/05/20 [phone]

## 2020-01-05 NOTE — Progress Notes (Signed)
Patient ambulated while on pulse ox.  Patient's SPO2 remained between 94% and 95% and his HR was between the 115 to 120.  Patient stated that he is feeling much better than when he first arrived at the ED.

## 2020-01-05 NOTE — Discharge Instructions (Addendum)
Use the albuterol inhaler, 2 to 4 puffed every four hours as needed for wheezing.

## 2020-01-18 ENCOUNTER — Telehealth: Payer: Self-pay | Admitting: Gastroenterology

## 2020-01-18 ENCOUNTER — Encounter: Payer: Self-pay | Admitting: Gastroenterology

## 2020-01-18 ENCOUNTER — Other Ambulatory Visit: Payer: Self-pay | Admitting: Gastroenterology

## 2020-01-18 MED ORDER — CIPROFLOXACIN HCL 500 MG PO TABS: 500.0000 mg | ORAL_TABLET | Freq: Two times a day (BID) | ORAL | 0 refills | Status: DC

## 2020-01-18 MED ORDER — METRONIDAZOLE 500 MG PO TABS: 500.0000 mg | ORAL_TABLET | Freq: Two times a day (BID) | ORAL | 0 refills | Status: DC

## 2020-01-18 MED ORDER — AMOXICILLIN-POT CLAVULANATE 875-125 MG PO TABS: 1.0000 | ORAL_TABLET | Freq: Two times a day (BID) | ORAL | 0 refills | Status: DC

## 2020-01-18 NOTE — Telephone Encounter (Signed)
Decline doxycyline  Augmentin 875 mg po bid with food, #20

## 2020-01-18 NOTE — Telephone Encounter (Signed)
Patient reports that he has had a one day hx of abdominal pain.  He reports central lower abdominal pain.  He denies fever.  He does not report any tenderness to palpation.  No pain with ambulation.  He reports pressure in the area with standing and other position changes.  No diarrhea or constipation.  He feels this is consistent with prior episodes of diverticulitis. NO APP appt this week.  Patient is scheduled for a screening colon with you in January.

## 2020-01-18 NOTE — Telephone Encounter (Signed)
Patient notified.  He will call back for any additional questions or concerns.  

## 2020-01-18 NOTE — Telephone Encounter (Signed)
Pharmacy states Cipro is on back order for several weeks. Pharmacist is requesting we send doxycycline in it's place. Please advise if this is appropriate Dr. Fuller Plan.

## 2020-01-18 NOTE — Telephone Encounter (Signed)
Clear liquids for 1-2 days and slowly advance to soft diet as he feels better.  Tylenol as needed for pain.  Cipro 500 mg po BID x 10 d Flagyl 500 mg po BID x 10 d

## 2020-01-18 NOTE — Telephone Encounter (Signed)
Pt is requesting a call back from a nurse to discuss pt's diverticulitis, pt would like to know if something could be prescribed for him.

## 2020-01-18 NOTE — Telephone Encounter (Signed)
Informed patient we are sending Augmentin to his pharmacy in place of the Cipro and Flagyl. Patient verbalized understanding.

## 2020-03-01 DIAGNOSIS — Z87891 Personal history of nicotine dependence: Secondary | ICD-10-CM | POA: Diagnosis not present

## 2020-03-01 DIAGNOSIS — Z8249 Family history of ischemic heart disease and other diseases of the circulatory system: Secondary | ICD-10-CM | POA: Diagnosis not present

## 2020-03-01 DIAGNOSIS — Z833 Family history of diabetes mellitus: Secondary | ICD-10-CM | POA: Diagnosis not present

## 2020-03-01 DIAGNOSIS — R030 Elevated blood-pressure reading, without diagnosis of hypertension: Secondary | ICD-10-CM | POA: Diagnosis not present

## 2020-03-01 DIAGNOSIS — E669 Obesity, unspecified: Secondary | ICD-10-CM | POA: Diagnosis not present

## 2020-03-01 DIAGNOSIS — Z6831 Body mass index (BMI) 31.0-31.9, adult: Secondary | ICD-10-CM | POA: Diagnosis not present

## 2020-03-01 DIAGNOSIS — Z823 Family history of stroke: Secondary | ICD-10-CM | POA: Diagnosis not present

## 2020-03-01 DIAGNOSIS — Z809 Family history of malignant neoplasm, unspecified: Secondary | ICD-10-CM | POA: Diagnosis not present

## 2020-03-23 ENCOUNTER — Encounter: Payer: BC Managed Care – PPO | Admitting: Gastroenterology

## 2020-05-13 ENCOUNTER — Encounter: Payer: Medicare HMO | Admitting: Gastroenterology

## 2020-05-19 DIAGNOSIS — H524 Presbyopia: Secondary | ICD-10-CM | POA: Diagnosis not present

## 2020-05-19 DIAGNOSIS — Z0100 Encounter for examination of eyes and vision without abnormal findings: Secondary | ICD-10-CM | POA: Diagnosis not present

## 2020-06-09 DIAGNOSIS — D485 Neoplasm of uncertain behavior of skin: Secondary | ICD-10-CM | POA: Diagnosis not present

## 2020-06-09 DIAGNOSIS — D1801 Hemangioma of skin and subcutaneous tissue: Secondary | ICD-10-CM | POA: Diagnosis not present

## 2020-06-09 DIAGNOSIS — L905 Scar conditions and fibrosis of skin: Secondary | ICD-10-CM | POA: Diagnosis not present

## 2020-06-09 DIAGNOSIS — D225 Melanocytic nevi of trunk: Secondary | ICD-10-CM | POA: Diagnosis not present

## 2020-06-09 DIAGNOSIS — C44519 Basal cell carcinoma of skin of other part of trunk: Secondary | ICD-10-CM | POA: Diagnosis not present

## 2020-08-23 DIAGNOSIS — C44519 Basal cell carcinoma of skin of other part of trunk: Secondary | ICD-10-CM | POA: Diagnosis not present

## 2020-09-01 DIAGNOSIS — D485 Neoplasm of uncertain behavior of skin: Secondary | ICD-10-CM | POA: Diagnosis not present

## 2020-09-01 DIAGNOSIS — L988 Other specified disorders of the skin and subcutaneous tissue: Secondary | ICD-10-CM | POA: Diagnosis not present

## 2020-09-29 DIAGNOSIS — Z791 Long term (current) use of non-steroidal anti-inflammatories (NSAID): Secondary | ICD-10-CM | POA: Diagnosis not present

## 2020-09-29 DIAGNOSIS — J309 Allergic rhinitis, unspecified: Secondary | ICD-10-CM | POA: Diagnosis not present

## 2020-09-29 DIAGNOSIS — Z8249 Family history of ischemic heart disease and other diseases of the circulatory system: Secondary | ICD-10-CM | POA: Diagnosis not present

## 2020-09-29 DIAGNOSIS — E669 Obesity, unspecified: Secondary | ICD-10-CM | POA: Diagnosis not present

## 2020-09-29 DIAGNOSIS — G8929 Other chronic pain: Secondary | ICD-10-CM | POA: Diagnosis not present

## 2020-09-29 DIAGNOSIS — Z823 Family history of stroke: Secondary | ICD-10-CM | POA: Diagnosis not present

## 2020-09-29 DIAGNOSIS — Z809 Family history of malignant neoplasm, unspecified: Secondary | ICD-10-CM | POA: Diagnosis not present

## 2020-09-29 DIAGNOSIS — Z85828 Personal history of other malignant neoplasm of skin: Secondary | ICD-10-CM | POA: Diagnosis not present

## 2020-09-29 DIAGNOSIS — Z833 Family history of diabetes mellitus: Secondary | ICD-10-CM | POA: Diagnosis not present

## 2020-09-29 DIAGNOSIS — M1990 Unspecified osteoarthritis, unspecified site: Secondary | ICD-10-CM | POA: Diagnosis not present

## 2021-01-09 ENCOUNTER — Other Ambulatory Visit: Payer: Self-pay

## 2021-01-09 ENCOUNTER — Ambulatory Visit (INDEPENDENT_AMBULATORY_CARE_PROVIDER_SITE_OTHER): Payer: Medicare HMO

## 2021-01-09 ENCOUNTER — Ambulatory Visit: Payer: Medicare HMO | Admitting: Orthopaedic Surgery

## 2021-01-09 DIAGNOSIS — M542 Cervicalgia: Secondary | ICD-10-CM

## 2021-01-09 DIAGNOSIS — M25512 Pain in left shoulder: Secondary | ICD-10-CM

## 2021-01-09 DIAGNOSIS — G8929 Other chronic pain: Secondary | ICD-10-CM | POA: Diagnosis not present

## 2021-01-09 MED ORDER — METHYLPREDNISOLONE ACETATE 40 MG/ML IJ SUSP
40.0000 mg | INTRAMUSCULAR | Status: AC | PRN
  Administered 2021-01-09: 40 mg via INTRA_ARTICULAR

## 2021-01-09 MED ORDER — METHYLPREDNISOLONE 4 MG PO TABS: ORAL_TABLET | ORAL | 0 refills | Status: DC

## 2021-01-09 MED ORDER — MELOXICAM 15 MG PO TABS: 15.0000 mg | ORAL_TABLET | Freq: Every day | ORAL | 3 refills | Status: DC

## 2021-01-09 MED ORDER — LIDOCAINE HCL 1 % IJ SOLN
3.0000 mL | INTRAMUSCULAR | Status: AC | PRN
  Administered 2021-01-09: 3 mL

## 2021-01-09 NOTE — Progress Notes (Signed)
Office Visit Note   Patient: Ricardo Hahn           Date of Birth: June 04, 1950           MRN: [id number] Visit Date: 01/09/2021              Requested by: Corine Shelter, PA-C [address] [place],  [place] [address] PCP: Corine Shelter, PA-C   Assessment & Plan: Visit Diagnoses:  1. Neck pain   2. Chronic left shoulder pain     Plan: I spoke to him about the clinical exam findings and x-ray findings.  I would like to send him to [facility] physical therapy to have them work on his neck with cervical traction and other modalities as well as work on the left shoulder to hopefully improve the function of the left shoulder.  I did recommend a steroid injection in the left shoulder subacromial outlet and he agreed to this and tolerated it well.  We will put him on a 6-day steroid taper and meloxicam as well.  All questions and concerns were answered addressed.  We will reevaluate in 4 weeks.  Follow-Up Instructions: Return in about 4 weeks (around 02/06/2021).   Orders:  Orders Placed This Encounter  Procedures   Large Joint Inj   XR Cervical Spine 2 or 3 views   XR Shoulder Left   Meds ordered this encounter  Medications   methylPREDNISolone (MEDROL) 4 MG tablet    Sig: Medrol dose pack. Take as instructed    Dispense:  21 tablet    Refill:  0   meloxicam (MOBIC) 15 MG tablet    Sig: Take 1 tablet (15 mg total) by mouth daily.    Dispense:  30 tablet    Refill:  3       Procedures: Large Joint Inj: L subacromial bursa on 01/09/2021 8:55 AM Indications: pain and diagnostic evaluation Details: 22 G 1.5 in needle  Arthrogram: No  Medications: 3 mL lidocaine 1 %; 40 mg methylPREDNISolone acetate 40 MG/ML Outcome: tolerated well, no immediate complications Procedure, treatment alternatives, risks and benefits explained, specific risks discussed. Consent was given by the patient. Immediately prior to procedure a time out was called to verify the correct patient,  procedure, equipment, support staff and site/side marked as required. Patient was prepped and draped in the usual sterile fashion.      Clinical Data: No additional findings.   Subjective: Chief Complaint  Patient presents with   Left Shoulder - Pain  The patient is sometimes seen for the first time but we have seen his wife.  He has been dealing with left shoulder pain for about a year now.  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  He worked in the computer for many years and also has been experiencing neck pain with numbness and tingling going down all the way into his left forearm and left hand with numbness and tingling in his thumb.  He is not a diabetic.  He denies any specific injuries.  He has been having problems reaching behind him and overhead with the left shoulder.  He has no issues on the right side at all.  He has tried just over-the-counter anti-inflammatories.  There is been no known injury.  He did do a lot of repetitive work for many years.  HPI  Review of Systems There is currently listed no headache, chest pain, shortness of breath, fever, chills, nausea, vomiting  Objective: Vital Signs: There were no vitals taken for this visit.  Physical Exam He is alert and orient x3 and in no acute distress Ortho Exam Examination of his left shoulder does show signs of impingement.  His internal rotation with adduction is only to the lower lumbar spine.  He has a lot of pain past 9 degrees of abduction of the shoulder.  Is clinically well located.  He has certainly some weak grip strength and weak triceps function on the left side and a positive Spurling sign to the left side as well.  Specialty Comments:  No specialty comments available.  Imaging: XR Cervical Spine 2 or 3 views  Result Date: 01/09/2021 2 views of the cervical spine showed mid cervical degenerative changes with disc space narrowing and osteophytes.  XR Shoulder Left  Result  Date: 01/09/2021 3 views of the left shoulder show no acute findings.    PMFS History: Patient Active Problem List   Diagnosis Date Noted   Contusion of foot, left 12/18/2016   Past Medical History:  Diagnosis Date   Allergy    seasonal   Arthritis     Family History  Problem Relation Age of Onset   Colon cancer Father 28   Colon cancer Brother 17   Colon cancer Paternal Grandfather 49    Past Surgical History:  Procedure Laterality Date   ingunial hernia repair Left 1975   Social History   Occupational History   Not on file  Tobacco Use   Smoking status: Former    Types: Cigarettes    Quit date: 12/15/1973    Years since quitting: 47.1   Smokeless tobacco: Current  Substance and Sexual Activity   Alcohol use: No   Drug use: No   Sexual activity: Not on file

## 2021-01-12 DIAGNOSIS — M7542 Impingement syndrome of left shoulder: Secondary | ICD-10-CM | POA: Diagnosis not present

## 2021-01-16 DIAGNOSIS — M7542 Impingement syndrome of left shoulder: Secondary | ICD-10-CM | POA: Diagnosis not present

## 2021-01-19 DIAGNOSIS — M7542 Impingement syndrome of left shoulder: Secondary | ICD-10-CM | POA: Diagnosis not present

## 2021-01-23 DIAGNOSIS — M7542 Impingement syndrome of left shoulder: Secondary | ICD-10-CM | POA: Diagnosis not present

## 2021-01-30 DIAGNOSIS — M7542 Impingement syndrome of left shoulder: Secondary | ICD-10-CM | POA: Diagnosis not present

## 2021-02-02 DIAGNOSIS — M7542 Impingement syndrome of left shoulder: Secondary | ICD-10-CM | POA: Diagnosis not present

## 2021-02-06 ENCOUNTER — Encounter: Payer: Self-pay | Admitting: Internal Medicine

## 2021-02-06 ENCOUNTER — Ambulatory Visit (INDEPENDENT_AMBULATORY_CARE_PROVIDER_SITE_OTHER): Payer: Medicare HMO | Admitting: Internal Medicine

## 2021-02-06 ENCOUNTER — Other Ambulatory Visit: Payer: Self-pay

## 2021-02-06 VITALS — BP 190/90 | HR 97 | Temp 98.1°F | Resp 16 | Ht 68.0 in | Wt 223.0 lb

## 2021-02-06 DIAGNOSIS — I10 Essential (primary) hypertension: Secondary | ICD-10-CM

## 2021-02-06 DIAGNOSIS — N400 Enlarged prostate without lower urinary tract symptoms: Secondary | ICD-10-CM | POA: Diagnosis not present

## 2021-02-06 DIAGNOSIS — R739 Hyperglycemia, unspecified: Secondary | ICD-10-CM | POA: Diagnosis not present

## 2021-02-06 DIAGNOSIS — R0989 Other specified symptoms and signs involving the circulatory and respiratory systems: Secondary | ICD-10-CM

## 2021-02-06 DIAGNOSIS — E785 Hyperlipidemia, unspecified: Secondary | ICD-10-CM | POA: Diagnosis not present

## 2021-02-06 DIAGNOSIS — Z0001 Encounter for general adult medical examination with abnormal findings: Secondary | ICD-10-CM | POA: Diagnosis not present

## 2021-02-06 DIAGNOSIS — Z23 Encounter for immunization: Secondary | ICD-10-CM | POA: Diagnosis not present

## 2021-02-06 DIAGNOSIS — R0609 Other forms of dyspnea: Secondary | ICD-10-CM | POA: Diagnosis not present

## 2021-02-06 DIAGNOSIS — R3912 Poor urinary stream: Secondary | ICD-10-CM | POA: Diagnosis not present

## 2021-02-06 DIAGNOSIS — N401 Enlarged prostate with lower urinary tract symptoms: Secondary | ICD-10-CM | POA: Diagnosis not present

## 2021-02-06 DIAGNOSIS — Z1159 Encounter for screening for other viral diseases: Secondary | ICD-10-CM

## 2021-02-06 LAB — TSH: TSH: 3.08 u[IU]/mL (ref 0.35–5.50)

## 2021-02-06 LAB — BASIC METABOLIC PANEL
BUN: 19 mg/dL (ref 6–23)
CO2: 27 mEq/L (ref 19–32)
Calcium: 9.2 mg/dL (ref 8.4–10.5)
Chloride: 104 mEq/L (ref 96–112)
Creatinine, Ser: 0.87 mg/dL (ref 0.40–1.50)
GFR: 87.3 mL/min (ref >60.00)
Glucose, Bld: 97 mg/dL (ref 70–99)
Potassium: 3.9 mEq/L (ref 3.5–5.1)
Sodium: 141 mEq/L (ref 135–145)

## 2021-02-06 LAB — CBC WITH DIFFERENTIAL/PLATELET
Basophils Absolute: 0.1 10*3/uL (ref 0.0–0.1)
Basophils Relative: 0.8 % (ref 0.0–3.0)
Eosinophils Absolute: 0.1 10*3/uL (ref 0.0–0.7)
Eosinophils Relative: 2.1 % (ref 0.0–5.0)
HCT: 41.1 % (ref 39.0–52.0)
Hemoglobin: 13.6 g/dL (ref 13.0–17.0)
Lymphocytes Relative: 20.9 % (ref 12.0–46.0)
Lymphs Abs: 1.4 10*3/uL (ref 0.7–4.0)
MCHC: 33.2 g/dL (ref 30.0–36.0)
MCV: 90.9 fl (ref 78.0–100.0)
Monocytes Absolute: 0.7 10*3/uL (ref 0.1–1.0)
Monocytes Relative: 10 % (ref 3.0–12.0)
Neutro Abs: 4.6 10*3/uL (ref 1.4–7.7)
Neutrophils Relative %: 66.2 % (ref 43.0–77.0)
Platelets: 178 10*3/uL (ref 150.0–400.0)
RBC: 4.52 Mil/uL (ref 4.22–5.81)
RDW: 14.4 % (ref 11.5–15.5)
WBC: 6.9 10*3/uL (ref 4.0–10.5)

## 2021-02-06 LAB — PSA: PSA: 0.73 ng/mL (ref 0.10–4.00)

## 2021-02-06 LAB — HEPATIC FUNCTION PANEL
ALT: 25 U/L (ref 0–53)
AST: 20 U/L (ref 0–37)
Albumin: 4.4 g/dL (ref 3.5–5.2)
Alkaline Phosphatase: 41 U/L (ref 39–117)
Bilirubin, Direct: 0.1 mg/dL (ref 0.0–0.3)
Total Bilirubin: 0.4 mg/dL (ref 0.2–1.2)
Total Protein: 7 g/dL (ref 6.0–8.3)

## 2021-02-06 LAB — LIPID PANEL
Cholesterol: 233 mg/dL — ABNORMAL HIGH (ref 0–200)
HDL: 43.3 mg/dL (ref >39.00)
NonHDL: 189.21
Total CHOL/HDL Ratio: 5
Triglycerides: 255 mg/dL — ABNORMAL HIGH (ref 0.0–149.0)
VLDL: 51 mg/dL — ABNORMAL HIGH (ref 0.0–40.0)

## 2021-02-06 LAB — LDL CHOLESTEROL, DIRECT: Direct LDL: 174 mg/dL

## 2021-02-06 LAB — HEMOGLOBIN A1C: Hgb A1c MFr Bld: 6.3 % (ref 4.6–6.5)

## 2021-02-06 MED ORDER — ROSUVASTATIN CALCIUM 20 MG PO TABS: 20.0000 mg | ORAL_TABLET | Freq: Every day | ORAL | 1 refills | Status: DC

## 2021-02-06 MED ORDER — CARVEDILOL PHOSPHATE ER 10 MG PO CP24: 10.0000 mg | ORAL_CAPSULE | Freq: Every day | ORAL | 0 refills | Status: DC

## 2021-02-06 MED ORDER — EDARBI 40 MG PO TABS: 1.0000 | ORAL_TABLET | Freq: Every day | ORAL | 0 refills | Status: DC

## 2021-02-06 MED ORDER — BOOSTRIX 5-2.5-18.5 LF-MCG/0.5 IM SUSP: 0.5000 mL | Freq: Once | INTRAMUSCULAR | 0 refills | Status: AC

## 2021-02-06 MED ORDER — SHINGRIX 50 MCG/0.5ML IM SUSR: 0.5000 mL | Freq: Once | INTRAMUSCULAR | 1 refills | Status: AC

## 2021-02-06 NOTE — Patient Instructions (Signed)

## 2021-02-06 NOTE — Progress Notes (Addendum)
Subjective:  Patient ID: Ricardo Hahn, male    DOB: Jun 12, 1950  Age: 70 y.o. MRN: [id number]  CC: Annual Exam and Hypertension  This visit occurred during the SARS-CoV-2 public health emergency.  Safety protocols were in place, including screening questions prior to the visit, additional usage of staff PPE, and extensive cleaning of exam room while observing appropriate contact time as indicated for disinfecting solutions.    HPI Ricardo Hahn presents for a CPX and to establish.  He only experiences DOE when he does heavy lifting.  He goes deer hunting and is very active but does not experience DOE while hunting.  He denies chest pain, headache, blurred vision, diaphoresis, edema, or palpitations.  History Ricardo Hahn has a past medical history of Allergy and Arthritis.   He has a past surgical history that includes ingunial hernia repair (Left, 1975).   His family history includes Colon cancer (age of onset: 51) in his brother; Colon cancer (age of onset: 12) in his father and paternal grandfather; Coronary artery disease in his father; Hypertension in his mother; Kidney failure in his mother; Stroke in his mother.He reports that he quit smoking about 47 years ago. His smoking use included cigarettes. He uses smokeless tobacco. He reports that he does not drink alcohol and does not use drugs.  Outpatient Medications Prior to Visit  Medication Sig Dispense Refill   meloxicam (MOBIC) 15 MG tablet Take 1 tablet (15 mg total) by mouth daily. 30 tablet 3   Multiple Vitamin (MULTIVITAMIN) tablet Take 1 tablet by mouth daily.     amoxicillin-clavulanate (AUGMENTIN) 875-125 MG tablet Take 1 tablet by mouth 2 (two) times daily. With food 20 tablet 0   methylPREDNISolone (MEDROL) 4 MG tablet Medrol dose pack. Take as instructed 21 tablet 0   predniSONE (DELTASONE) 10 MG tablet Take 4 tablets (40 mg total) by mouth daily. 20 tablet 0   No facility-administered medications prior to visit.     ROS Review of Systems  Constitutional:  Positive for unexpected weight change (wt gain). Negative for chills, diaphoresis and fatigue.  HENT: Negative.    Eyes: Negative.   Respiratory:  Positive for shortness of breath. Negative for cough, chest tightness and wheezing.   Cardiovascular:  Negative for chest pain, palpitations and leg swelling.  Gastrointestinal:  Negative for abdominal pain, blood in stool, constipation, diarrhea, nausea and vomiting.  Endocrine: Negative.   Genitourinary: Negative.  Negative for difficulty urinating, dysuria and hematuria.  Musculoskeletal: Negative.  Negative for arthralgias, joint swelling and myalgias.  Skin: Negative.  Negative for color change.  Neurological:  Negative for dizziness, weakness, light-headedness and headaches.  Hematological:  Negative for adenopathy. Does not bruise/bleed easily.  Psychiatric/Behavioral: Negative.     Objective:  BP (!) 190/90 (BP Location: Right Arm, Patient Position: Sitting, Cuff Size: Large)   Pulse 97   Temp 98.1 F (36.7 C) (Oral)   Resp 16   Ht 5\' 8"  (1.727 m)   Wt 223 lb (101.2 kg)   SpO2 98%   BMI 33.91 kg/m   Physical Exam Vitals reviewed.  Constitutional:      Appearance: He is obese. He is not ill-appearing.  HENT:     Nose: Nose normal.     Mouth/Throat:     Mouth: Mucous membranes are moist.  Eyes:     General: No scleral icterus.    Conjunctiva/sclera: Conjunctivae normal.  Cardiovascular:     Rate and Rhythm: Normal rate and regular rhythm. Occasional Extrasystoles are present.  Heart sounds: Normal heart sounds, S1 normal and S2 normal. No murmur heard.   No friction rub. No gallop.     Comments: EKG- SR with PAC's, 94 bpm QTc=465 Mild LVH in V2-V4 No Q waves Pulmonary:     Effort: Pulmonary effort is normal.     Breath sounds: No stridor. No wheezing, rhonchi or rales.  Abdominal:     General: Abdomen is protuberant. Bowel sounds are normal. There is no distension.      Palpations: Abdomen is soft. There is no hepatomegaly, splenomegaly or mass.     Tenderness: There is no abdominal tenderness.     Hernia: There is no hernia in the left inguinal area or right inguinal area.  Genitourinary:    Pubic Area: No rash.      Penis: Normal and uncircumcised. No erythema, discharge, swelling or lesions.      Testes: Normal.        Right: Mass, tenderness or swelling not present.        Left: Mass, tenderness or swelling not present.     Epididymis:     Right: Normal.     Left: Normal.     Prostate: Enlarged. Not tender and no nodules present.     Rectum: Normal. Guaiac result negative. No mass, tenderness, anal fissure, external hemorrhoid or internal hemorrhoid. Normal anal tone.  Musculoskeletal:     Cervical back: Neck supple.     Right lower leg: Pitting Edema (trace) present.     Left lower leg: Pitting Edema (trace) present.  Lymphadenopathy:     Cervical: No cervical adenopathy.     Lower Body: No right inguinal adenopathy. No left inguinal adenopathy.  Skin:    General: Skin is warm and dry.     Findings: No rash.  Neurological:     General: No focal deficit present.     Mental Status: He is alert.  Psychiatric:        Mood and Affect: Mood normal.        Behavior: Behavior normal.    Lab Results  Component Value Date   WBC 6.9 02/06/2021   HGB 13.6 02/06/2021   HCT 41.1 02/06/2021   PLT 178.0 02/06/2021   GLUCOSE 97 02/06/2021   CHOL 233 (H) 02/06/2021   TRIG 255.0 (H) 02/06/2021   HDL 43.30 02/06/2021   LDLDIRECT 174.0 02/06/2021   ALT 25 02/06/2021   AST 20 02/06/2021   NA 141 02/06/2021   K 3.9 02/06/2021   CL 104 02/06/2021   CREATININE 0.87 02/06/2021   BUN 19 02/06/2021   CO2 27 02/06/2021   TSH 3.08 02/06/2021   PSA 0.73 02/06/2021   HGBA1C 6.3 02/06/2021     Assessment & Plan:   Ricardo Hahn was seen today for annual exam and hypertension.  Diagnoses and all orders for this visit:  Encounter for general adult medical  examination with abnormal findings- Exam completed, labs reviewed, vaccines reviewed and updated, cancer screenings are up-to-date, patient education was given.  Primary hypertension- He has stage 3 HTN. Will check labs to screen for secondary causes and end organ damage. WIll treat with a BB, ARB, and thiazide diuretic. -     CBC with Differential/Platelet; Future -     Basic metabolic panel; Future -     Aldosterone + renin activity w/ ratio; Future -     TSH; Future -     Urinalysis, Routine w reflex microscopic; Future -     Hepatic function  panel; Future -     EKG 12-Lead -     Hepatic function panel -     Urinalysis, Routine w reflex microscopic -     TSH -     Aldosterone + renin activity w/ ratio -     Basic metabolic panel -     CBC with Differential/Platelet -     carvedilol (COREG CR) 10 MG 24 hr capsule; Take 1 capsule (10 mg total) by mouth daily. -     Azilsartan Medoxomil (EDARBI) 40 MG TABS; Take 1 tablet by mouth daily.  Hyperlipidemia LDL goal <100- His ASCVD risk score is 37%.  I recommend that he take a statin for cardiovascular risk reduction. -     Lipid panel; Future -     TSH; Future -     Hepatic function panel; Future -     Hepatic function panel -     TSH -     Lipid panel -     rosuvastatin (CRESTOR) 20 MG tablet; Take 1 tablet (20 mg total) by mouth daily.  Chronic hyperglycemia- He is prediabetic.  He will work on his lifestyle modifications. -     Basic metabolic panel; Future -     Hemoglobin A1c; Future -     Hemoglobin A1c -     Basic metabolic panel  Need for hepatitis C screening test -     Hepatitis C antibody; Future -     Hepatitis C antibody  Benign prostatic hyperplasia without lower urinary tract symptoms- His PSA is reassuring. -     Cancel: PSA; Future -     Urinalysis, Routine w reflex microscopic; Future -     Urinalysis, Routine w reflex microscopic  Need for prophylactic vaccination with combined diphtheria-tetanus-pertussis  (DTP) vaccine -     Tdap (BOOSTRIX) 5-2.5-18.5 LF-MCG/0.5 injection; Inject 0.5 mLs into the muscle once for 1 dose.  Need for shingles vaccine -     Zoster Vaccine Adjuvanted [facility]) injection; Inject 0.5 mLs into the muscle once for 1 dose.  Benign prostatic hyperplasia with weak urinary stream -     PSA; Future -     PSA  Flu vaccine need -     Flu Vaccine QUAD High Dose(Fluad)  Need for vaccination -     Pneumococcal conjugate vaccine 20-valent (Prevnar 20)  Abdominal bruit- Will screen for AAA. -     VAS [place] AAA DUPLEX; Future  DOE (dyspnea on exertion)- Will screen for atherosclerosis. -     CT CARDIAC SCORING (SELF PAY ONLY); Future  Other orders -     LDL cholesterol, direct  I have discontinued Slaton Fuerte's predniSONE, amoxicillin-clavulanate, methylPREDNISolone, carvedilol, and Edarbi. I am also having him start on Boostrix, Shingrix, rosuvastatin, indapamide, valsartan, and carvedilol. Additionally, I am having him maintain his multivitamin and meloxicam.  Meds ordered this encounter  Medications   Tdap (BOOSTRIX) 5-2.5-18.5 LF-MCG/0.5 injection    Sig: Inject 0.5 mLs into the muscle once for 1 dose.    Dispense:  0.5 mL    Refill:  0   Zoster Vaccine Adjuvanted [facility]) injection    Sig: Inject 0.5 mLs into the muscle once for 1 dose.    Dispense:  0.5 mL    Refill:  1   rosuvastatin (CRESTOR) 20 MG tablet    Sig: Take 1 tablet (20 mg total) by mouth daily.    Dispense:  90 tablet    Refill:  1   DISCONTD:  carvedilol (COREG CR) 10 MG 24 hr capsule    Sig: Take 1 capsule (10 mg total) by mouth daily.    Dispense:  90 capsule    Refill:  0   DISCONTD: Azilsartan Medoxomil (EDARBI) 40 MG TABS    Sig: Take 1 tablet by mouth daily.    Dispense:  90 tablet    Refill:  0   indapamide (LOZOL) 1.25 MG tablet    Sig: Take 1 tablet (1.25 mg total) by mouth daily.    Dispense:  90 tablet    Refill:  0   valsartan (DIOVAN) 160 MG tablet    Sig: Take 1  tablet (160 mg total) by mouth daily.    Dispense:  90 tablet    Refill:  0   carvedilol (COREG) 3.125 MG tablet    Sig: Take 1 tablet (3.125 mg total) by mouth 2 (two) times daily with a meal.    Dispense:  180 tablet    Refill:  0     Follow-up: Return in about 6 weeks (around 03/20/2021).  Scarlette Calico, MD

## 2021-02-07 ENCOUNTER — Encounter: Payer: Self-pay | Admitting: Internal Medicine

## 2021-02-07 ENCOUNTER — Ambulatory Visit: Payer: Medicare HMO | Admitting: Orthopaedic Surgery

## 2021-02-07 ENCOUNTER — Encounter: Payer: Self-pay | Admitting: Orthopaedic Surgery

## 2021-02-07 DIAGNOSIS — G8929 Other chronic pain: Secondary | ICD-10-CM | POA: Diagnosis not present

## 2021-02-07 DIAGNOSIS — R0609 Other forms of dyspnea: Secondary | ICD-10-CM | POA: Insufficient documentation

## 2021-02-07 DIAGNOSIS — M25512 Pain in left shoulder: Secondary | ICD-10-CM

## 2021-02-07 DIAGNOSIS — M542 Cervicalgia: Secondary | ICD-10-CM | POA: Diagnosis not present

## 2021-02-07 LAB — URINALYSIS, ROUTINE W REFLEX MICROSCOPIC
Bilirubin Urine: NEGATIVE
Hgb urine dipstick: NEGATIVE
Ketones, ur: NEGATIVE
Leukocytes,Ua: NEGATIVE
Nitrite: NEGATIVE
RBC / HPF: NONE SEEN (ref 0–?)
Specific Gravity, Urine: 1.02 (ref 1.000–1.030)
Total Protein, Urine: NEGATIVE
Urine Glucose: NEGATIVE
Urobilinogen, UA: 0.2 (ref 0.0–1.0)
pH: 6.5 (ref 5.0–8.0)

## 2021-02-07 MED ORDER — INDAPAMIDE 1.25 MG PO TABS: 1.2500 mg | ORAL_TABLET | Freq: Every day | ORAL | 0 refills | Status: DC

## 2021-02-07 NOTE — Progress Notes (Signed)
The patient is an active 70 year old gentleman who is following up for left-sided neck pain and trapezius pain as well as left shoulder pain.  He also had radicular pain with numbness and tingling in his left hand.  He has been to physical therapy at [facility] physical therapy.  He said that is really helped greatly.  He says neck pain and trapezius pain is gone and his shoulder motion is getting better.  His biggest limitation was reaching behind him.  That has improved he states.  He says he just has a little bit of numbness in his thumb tip on the left side.  He is very pleased with the therapy process thus far.  On exam he does have good range of motion of his left shoulder except for internal rotation with adduction but is still improved where he can reach the lumbar spine at the mid lumbar and is little bit higher.  I can even get him up higher passively.  He has good range of motion of his neck as well and good strength in his left upper extremity.  He has 4 more physical therapy sessions and I think he should attend those as to see.  I will see him back in about 6 weeks because we can always consider repeat injections if needed.  All questions and concerns were answered and addressed.

## 2021-02-08 ENCOUNTER — Telehealth: Payer: Self-pay | Admitting: Internal Medicine

## 2021-02-08 ENCOUNTER — Other Ambulatory Visit: Payer: Self-pay | Admitting: Internal Medicine

## 2021-02-08 NOTE — Telephone Encounter (Signed)
Here is a list of what insurance said would work:   .Carvediol Tablet  .Metoprolol ER ( patients preference)  . Ebarbi  .Losartan .Valsartan .Telemisartan .Irbesartan

## 2021-02-08 NOTE — Telephone Encounter (Signed)
Patient requesting a call back to discuss   Azilsartan Medoxomil (EDARBI) 40 MG TABS carvedilol (COREG CR) 10 MG 24 hr capsule

## 2021-02-08 NOTE — Telephone Encounter (Signed)
Pt stated that he can not afford the 2 new BP meds that we Rx'd. I recommended that he reach out to his insurance company and inform them of the meds that he was prescribed and find out what alternatives they cover that are on his formulary. Dr. Ronnald Ramp could prescribe on of those options. He stated that he would reach out to his insurance company and give [place] a call back.

## 2021-02-09 MED ORDER — VALSARTAN 160 MG PO TABS: 160.0000 mg | ORAL_TABLET | Freq: Every day | ORAL | 0 refills | Status: DC

## 2021-02-09 MED ORDER — CARVEDILOL 3.125 MG PO TABS: 3.1250 mg | ORAL_TABLET | Freq: Two times a day (BID) | ORAL | 0 refills | Status: DC

## 2021-02-09 NOTE — Addendum Note (Signed)
Addended by: Janith Lima on: 02/09/2021 08:01 AM   Modules accepted: Orders

## 2021-02-13 DIAGNOSIS — M7542 Impingement syndrome of left shoulder: Secondary | ICD-10-CM | POA: Diagnosis not present

## 2021-02-13 LAB — ALDOSTERONE + RENIN ACTIVITY W/ RATIO
ALDO / PRA Ratio: 6.1 Ratio (ref 0.9–28.9)
Aldosterone: 2 ng/dL
Renin Activity: 0.33 ng/mL/h (ref 0.25–5.82)

## 2021-02-13 LAB — HEPATITIS C ANTIBODY
Hepatitis C Ab: NONREACTIVE
SIGNAL TO CUT-OFF: 0.08 (ref <1.00)

## 2021-02-16 DIAGNOSIS — M7542 Impingement syndrome of left shoulder: Secondary | ICD-10-CM | POA: Diagnosis not present

## 2021-02-21 DIAGNOSIS — M7542 Impingement syndrome of left shoulder: Secondary | ICD-10-CM | POA: Diagnosis not present

## 2021-03-07 ENCOUNTER — Other Ambulatory Visit: Payer: Self-pay

## 2021-03-07 ENCOUNTER — Ambulatory Visit (HOSPITAL_COMMUNITY)
Admission: RE | Admit: 2021-03-07 | Discharge: 2021-03-07 | Disposition: A | Discharge status: Home / Self Care | Payer: Medicare HMO | Source: Ambulatory Visit | Attending: Internal Medicine | Admitting: Internal Medicine

## 2021-03-07 DIAGNOSIS — R0989 Other specified symptoms and signs involving the circulatory and respiratory systems: Secondary | ICD-10-CM | POA: Diagnosis not present

## 2021-03-21 ENCOUNTER — Ambulatory Visit: Payer: Medicare HMO | Admitting: Orthopaedic Surgery

## 2021-03-22 ENCOUNTER — Other Ambulatory Visit: Payer: Self-pay

## 2021-03-22 ENCOUNTER — Ambulatory Visit: Payer: Medicare HMO | Admitting: Orthopaedic Surgery

## 2021-03-22 ENCOUNTER — Encounter: Payer: Self-pay | Admitting: Orthopaedic Surgery

## 2021-03-22 DIAGNOSIS — G8929 Other chronic pain: Secondary | ICD-10-CM

## 2021-03-22 DIAGNOSIS — M25512 Pain in left shoulder: Secondary | ICD-10-CM | POA: Diagnosis not present

## 2021-03-22 DIAGNOSIS — M542 Cervicalgia: Secondary | ICD-10-CM | POA: Diagnosis not present

## 2021-03-22 NOTE — Progress Notes (Signed)
The patient is a very pleasant 71 year old gentleman who is being seen in follow-up for his left shoulder after having gone through physical therapy and significant sessions to help get his shoulder stronger and moving better.  His x-ray showed well-maintained shoulder joint.  He was also dealing with neck issues and he said the neck issues are 95% better and the nerve pain is almost gone.  However he is concerned about the strength and weakness as well as decreased motion of his left shoulder in light of activity modification, outpatient physical therapy, steroid injections and time.  Examination of his right shoulder still shows weakness in the rotator cuff and decreased abduction as well as forward flexion.  His rotation is also weak.  He has a negative lift off.  At this point a MRI of his left shoulder is warranted to rule out a rotator cuff tear and to assess the soft tissues given the failure of all forms of conservative treatment including outpatient physical therapy.  We will see him in follow-up after the MRI.  He agrees with this treatment plan.

## 2021-03-22 NOTE — Addendum Note (Signed)
Addended by: Elvin So L on: 03/22/2021 11:12 AM   Modules accepted: Orders

## 2021-03-23 ENCOUNTER — Encounter: Payer: Self-pay | Admitting: Internal Medicine

## 2021-03-23 ENCOUNTER — Ambulatory Visit (INDEPENDENT_AMBULATORY_CARE_PROVIDER_SITE_OTHER): Payer: Medicare HMO | Admitting: Internal Medicine

## 2021-03-23 VITALS — BP 148/86 | HR 98 | Temp 98.2°F | Resp 16 | Ht 68.0 in | Wt 217.0 lb

## 2021-03-23 DIAGNOSIS — I10 Essential (primary) hypertension: Secondary | ICD-10-CM | POA: Diagnosis not present

## 2021-03-23 DIAGNOSIS — R7303 Prediabetes: Secondary | ICD-10-CM | POA: Insufficient documentation

## 2021-03-23 LAB — BASIC METABOLIC PANEL
BUN: 17 mg/dL (ref 6–23)
CO2: 30 mEq/L (ref 19–32)
Calcium: 9.4 mg/dL (ref 8.4–10.5)
Chloride: 100 mEq/L (ref 96–112)
Creatinine, Ser: 0.91 mg/dL (ref 0.40–1.50)
GFR: 85.19 mL/min (ref >60.00)
Glucose, Bld: 105 mg/dL — ABNORMAL HIGH (ref 70–99)
Potassium: 3.7 mEq/L (ref 3.5–5.1)
Sodium: 138 mEq/L (ref 135–145)

## 2021-03-23 MED ORDER — CARVEDILOL 6.25 MG PO TABS: 6.2500 mg | ORAL_TABLET | Freq: Two times a day (BID) | ORAL | 1 refills | Status: DC

## 2021-03-23 NOTE — Patient Instructions (Signed)

## 2021-03-23 NOTE — Progress Notes (Signed)
Subjective:  Patient ID: Ricardo Hahn, male    DOB: [birth year]  Age: 71 y.o. MRN: [id number]  CC: Hypertension  This visit occurred during the SARS-CoV-2 public health emergency.  Safety protocols were in place, including screening questions prior to the visit, additional usage of staff PPE, and extensive cleaning of exam room while observing appropriate contact time as indicated for disinfecting solutions.    HPI Ricardo Hahn presents for f/up -   His blood pressure is much better.  He is tolerating the medicines well with no dizziness or lightheadedness.  He is active and denies chest pain, shortness of breath, presyncope, palpitations, dizziness, or lightheadedness.  Outpatient Medications Prior to Visit  Medication Sig Dispense Refill   indapamide (LOZOL) 1.25 MG tablet Take 1 tablet (1.25 mg total) by mouth daily. 90 tablet 0   meloxicam (MOBIC) 15 MG tablet Take 1 tablet (15 mg total) by mouth daily. 30 tablet 3   Multiple Vitamin (MULTIVITAMIN) tablet Take 1 tablet by mouth daily.     rosuvastatin (CRESTOR) 20 MG tablet Take 1 tablet (20 mg total) by mouth daily. 90 tablet 1   valsartan (DIOVAN) 160 MG tablet Take 1 tablet (160 mg total) by mouth daily. 90 tablet 0   carvedilol (COREG) 3.125 MG tablet Take 1 tablet (3.125 mg total) by mouth 2 (two) times daily with a meal. 180 tablet 0   No facility-administered medications prior to visit.    ROS Review of Systems  Constitutional:  Negative for diaphoresis, fatigue and unexpected weight change.  HENT: Negative.    Eyes: Negative.   Respiratory: Negative.  Negative for apnea, cough, shortness of breath and wheezing.   Cardiovascular:  Negative for chest pain, palpitations and leg swelling.  Gastrointestinal:  Negative for abdominal pain, constipation, diarrhea, nausea and vomiting.  Endocrine: Negative.   Genitourinary: Negative.  Negative for difficulty urinating and hematuria.  Musculoskeletal:  Negative for  arthralgias and myalgias.  Skin: Negative.   Neurological:  Negative for dizziness, weakness and light-headedness.  Hematological:  Negative for adenopathy. Does not bruise/bleed easily.  Psychiatric/Behavioral: Negative.     Objective:  BP (!) 148/86 (BP Location: Left Arm, Patient Position: Sitting, Cuff Size: Large) Comment: thigh cuff   Pulse 98    Temp 98.2 F (36.8 C) (Oral)    Resp 16    Ht 5\' 8"  (1.727 m)    Wt 217 lb (98.4 kg)    SpO2 96%    BMI 32.99 kg/m   BP Readings from Last 3 Encounters:  03/23/21 (!) 148/86  02/06/21 (!) 190/90  01/05/20 (!) 163/72    Wt Readings from Last 3 Encounters:  03/23/21 217 lb (98.4 kg)  02/06/21 223 lb (101.2 kg)  01/04/20 220 lb (99.8 kg)    Physical Exam Vitals reviewed.  HENT:     Nose: Nose normal.     Mouth/Throat:     Mouth: Mucous membranes are moist.  Eyes:     General: No scleral icterus.    Conjunctiva/sclera: Conjunctivae normal.  Cardiovascular:     Rate and Rhythm: Normal rate. Occasional Extrasystoles are present.    Pulses: Normal pulses.     Heart sounds: No murmur heard. Pulmonary:     Effort: Pulmonary effort is normal.     Breath sounds: No stridor. No wheezing, rhonchi or rales.  Abdominal:     General: Abdomen is protuberant. Bowel sounds are normal. There is no distension.     Palpations: There is no hepatomegaly, splenomegaly  or mass.     Tenderness: There is no abdominal tenderness.  Musculoskeletal:        General: Normal range of motion.     Cervical back: Neck supple.     Right lower leg: No edema.     Left lower leg: No edema.  Skin:    General: Skin is warm and dry.     Coloration: Skin is not pale.  Neurological:     General: No focal deficit present.     Mental Status: He is alert. Mental status is at baseline.  Psychiatric:        Mood and Affect: Mood normal.        Behavior: Behavior normal.    Lab Results  Component Value Date   WBC 6.9 02/06/2021   HGB 13.6 02/06/2021   HCT  41.1 02/06/2021   PLT 178.0 02/06/2021   GLUCOSE 105 (H) 03/23/2021   CHOL 233 (H) 02/06/2021   TRIG 255.0 (H) 02/06/2021   HDL 43.30 02/06/2021   LDLDIRECT 174.0 02/06/2021   ALT 25 02/06/2021   AST 20 02/06/2021   NA 138 03/23/2021   K 3.7 03/23/2021   CL 100 03/23/2021   CREATININE 0.91 03/23/2021   BUN 17 03/23/2021   CO2 30 03/23/2021   TSH 3.08 02/06/2021   PSA 0.73 02/06/2021   HGBA1C 6.3 02/06/2021    VAS [place] AAA DUPLEX  Result Date: 03/07/2021 ABDOMINAL AORTA STUDY Patient Name:  Ricardo Hahn  Date of Exam:   03/07/2021 Medical Rec #: [id number]        Accession #:    [id number] Date of Birth: [birth year]        Patient Gender: M Patient Age:   45 years Exam Location:  [facility] Procedure:      VAS [place] AAA DUPLEX Referring Phys: Scarlette Calico --------------------------------------------------------------------------------  Indications: Bruit prior tabacco abuse Limitations: Air/bowel gas.  Comparison Study: No priors. Performing Technologist: Oda Cogan RDMS, RVT  Examination Guidelines: A complete evaluation includes B-mode imaging, spectral Doppler, color Doppler, and power Doppler as needed of all accessible portions of each vessel. Bilateral testing is considered an integral part of a complete examination. Limited examinations for reoccurring indications may be performed as noted.  Abdominal Aorta Findings: +-----------+-------+----------+----------+---------+--------+--------+  Location    AP (cm) Trans (cm) PSV (cm/s) Waveform  Thrombus Comments  +-----------+-------+----------+----------+---------+--------+--------+  Proximal    1.50    1.40       102        triphasic                    +-----------+-------+----------+----------+---------+--------+--------+  Mid         1.40    1.40       84                                      +-----------+-------+----------+----------+---------+--------+--------+  Distal      1.60    1.50       92         triphasic                     +-----------+-------+----------+----------+---------+--------+--------+  RT CIA Prox 0.9     0.9        106                                     +-----------+-------+----------+----------+---------+--------+--------+  RT EIA Prox 0.9     0.9        122        triphasic                    +-----------+-------+----------+----------+---------+--------+--------+  LT CIA Prox 1.0     0.9        69         biphasic                     +-----------+-------+----------+----------+---------+--------+--------+  LT EIA Prox 0.6     0.6        114        triphasic                    +-----------+-------+----------+----------+---------+--------+--------+  Summary: Abdominal Aorta: No evidence of an abdominal aortic aneurysm was visualized.  *See table(s) above for measurements and observations.  Electronically signed by Monica Martinez MD on 03/07/2021 at 9:02:47 AM.    Final     Assessment & Plan:   Ricardo Hahn was seen today for hypertension.  Diagnoses and all orders for this visit:  Primary hypertension- His blood pressure has improved but he has not achieved his goal of 130/80.  He will continue improving his lifestyle modifications and I will increase the dose of carvedilol.  The other antihypertensives will stay at the same dosage. -     carvedilol (COREG) 6.25 MG tablet; Take 1 tablet (6.25 mg total) by mouth 2 (two) times daily with a meal. -     Basic metabolic panel; Future -     Basic metabolic panel  Prediabetes- Medical therapy is not yet indicated. -     Basic metabolic panel; Future -     Basic metabolic panel   I have discontinued Blessing Roebuck's carvedilol. I am also having him start on carvedilol. Additionally, I am having him maintain his multivitamin, meloxicam, rosuvastatin, indapamide, and valsartan.  Meds ordered this encounter  Medications   carvedilol (COREG) 6.25 MG tablet    Sig: Take 1 tablet (6.25 mg total) by mouth 2 (two) times daily with a meal.    Dispense:  180  tablet    Refill:  1     Follow-up: Return in about 6 months (around 09/20/2021).  Scarlette Calico, MD

## 2021-03-27 DIAGNOSIS — E669 Obesity, unspecified: Secondary | ICD-10-CM | POA: Diagnosis not present

## 2021-03-27 DIAGNOSIS — Z87892 Personal history of anaphylaxis: Secondary | ICD-10-CM | POA: Diagnosis not present

## 2021-03-27 DIAGNOSIS — Z809 Family history of malignant neoplasm, unspecified: Secondary | ICD-10-CM | POA: Diagnosis not present

## 2021-03-27 DIAGNOSIS — Z91013 Allergy to seafood: Secondary | ICD-10-CM | POA: Diagnosis not present

## 2021-03-27 DIAGNOSIS — Z008 Encounter for other general examination: Secondary | ICD-10-CM | POA: Diagnosis not present

## 2021-03-27 DIAGNOSIS — Z85828 Personal history of other malignant neoplasm of skin: Secondary | ICD-10-CM | POA: Diagnosis not present

## 2021-03-27 DIAGNOSIS — Z7722 Contact with and (suspected) exposure to environmental tobacco smoke (acute) (chronic): Secondary | ICD-10-CM | POA: Diagnosis not present

## 2021-03-27 DIAGNOSIS — E785 Hyperlipidemia, unspecified: Secondary | ICD-10-CM | POA: Diagnosis not present

## 2021-03-27 DIAGNOSIS — R32 Unspecified urinary incontinence: Secondary | ICD-10-CM | POA: Diagnosis not present

## 2021-03-27 DIAGNOSIS — Z87891 Personal history of nicotine dependence: Secondary | ICD-10-CM | POA: Diagnosis not present

## 2021-03-27 DIAGNOSIS — I10 Essential (primary) hypertension: Secondary | ICD-10-CM | POA: Diagnosis not present

## 2021-03-27 DIAGNOSIS — Z6833 Body mass index (BMI) 33.0-33.9, adult: Secondary | ICD-10-CM | POA: Diagnosis not present

## 2021-03-27 DIAGNOSIS — Z8249 Family history of ischemic heart disease and other diseases of the circulatory system: Secondary | ICD-10-CM | POA: Diagnosis not present

## 2021-04-13 ENCOUNTER — Other Ambulatory Visit: Payer: Self-pay

## 2021-04-13 ENCOUNTER — Ambulatory Visit
Admission: RE | Admit: 2021-04-13 | Discharge: 2021-04-13 | Disposition: A | Discharge status: Home / Self Care | Payer: Medicare HMO | Source: Ambulatory Visit | Attending: Orthopaedic Surgery | Admitting: Orthopaedic Surgery

## 2021-04-13 DIAGNOSIS — M25512 Pain in left shoulder: Secondary | ICD-10-CM

## 2021-04-13 DIAGNOSIS — R600 Localized edema: Secondary | ICD-10-CM | POA: Diagnosis not present

## 2021-04-13 DIAGNOSIS — M7552 Bursitis of left shoulder: Secondary | ICD-10-CM | POA: Diagnosis not present

## 2021-04-13 DIAGNOSIS — G8929 Other chronic pain: Secondary | ICD-10-CM

## 2021-04-13 DIAGNOSIS — M19012 Primary osteoarthritis, left shoulder: Secondary | ICD-10-CM | POA: Diagnosis not present

## 2021-04-13 DIAGNOSIS — M75102 Unspecified rotator cuff tear or rupture of left shoulder, not specified as traumatic: Secondary | ICD-10-CM | POA: Diagnosis not present

## 2021-04-17 ENCOUNTER — Other Ambulatory Visit: Payer: Self-pay

## 2021-04-17 ENCOUNTER — Encounter: Payer: Self-pay | Admitting: Orthopaedic Surgery

## 2021-04-17 ENCOUNTER — Ambulatory Visit: Payer: Medicare HMO | Admitting: Orthopaedic Surgery

## 2021-04-17 DIAGNOSIS — M75111 Incomplete rotator cuff tear or rupture of right shoulder, not specified as traumatic: Secondary | ICD-10-CM

## 2021-04-17 DIAGNOSIS — M25512 Pain in left shoulder: Secondary | ICD-10-CM

## 2021-04-17 DIAGNOSIS — G8929 Other chronic pain: Secondary | ICD-10-CM

## 2021-04-17 MED ORDER — METHYLPREDNISOLONE ACETATE 40 MG/ML IJ SUSP
40.0000 mg | INTRAMUSCULAR | Status: AC | PRN
  Administered 2021-04-17: 40 mg via INTRA_ARTICULAR

## 2021-04-17 MED ORDER — MELOXICAM 15 MG PO TABS: 15.0000 mg | ORAL_TABLET | Freq: Every day | ORAL | 3 refills | Status: DC | PRN

## 2021-04-17 MED ORDER — LIDOCAINE HCL 1 % IJ SOLN
3.0000 mL | INTRAMUSCULAR | Status: AC | PRN
  Administered 2021-04-17: 3 mL

## 2021-04-17 NOTE — Progress Notes (Signed)
Office Visit Note   Patient: Ricardo Hahn           Date of Birth: 01-05-1951           MRN: [id number] Visit Date: 04/17/2021              Requested by: Janith Lima, MD [address] [place],  [place] [address] PCP: Janith Lima, MD   Assessment & Plan: Visit Diagnoses:  1. Chronic left shoulder pain   2. Nontraumatic incomplete tear of right rotator cuff     Plan: I did recommend an injection in his left shoulder subacromial area which he agreed to and tolerated well.  He will continue home exercise program and I did refill his meloxicam.  All questions and concerns were answered addressed.  I will see him back in 6 weeks to see how he is doing overall.  Follow-Up Instructions: Return in about 6 weeks (around 05/29/2021).   Orders:  No orders of the defined types were placed in this encounter.  No orders of the defined types were placed in this encounter.     Procedures: Large Joint Inj: L subacromial bursa on 04/17/2021 8:25 AM Indications: pain and diagnostic evaluation Details: 22 G 1.5 in needle  Arthrogram: No  Medications: 3 mL lidocaine 1 %; 40 mg methylPREDNISolone acetate 40 MG/ML Outcome: tolerated well, no immediate complications Procedure, treatment alternatives, risks and benefits explained, specific risks discussed. Consent was given by the patient. Immediately prior to procedure a time out was called to verify the correct patient, procedure, equipment, support staff and site/side marked as required. Patient was prepped and draped in the usual sterile fashion.      Clinical Data: No additional findings.   Subjective: Chief Complaint  Patient presents with   Left Shoulder - Follow-up  The patient comes in today to go over MRI of his left shoulder.  He has been through physical therapy for the left shoulder.  He 71 years old.  Most of his pain comes from reaching across the front of and behind.  It is felt a little bit better with a steroid  injection and therapy.  He still gets occasional numbness in his left thumb.  He says some days he goes without it at all.  HPI  Review of Systems There is currently listed no fever, chills, nausea, vomiting  Objective: Vital Signs: There were no vitals taken for this visit.  Physical Exam He is alert and orient x3 and in no acute distress Ortho Exam Examination of his left shoulder shows good strength in the rotator cuff and positive Neer and Hawkins signs. Specialty Comments:  No specialty comments available.  Imaging: No results found. The MRI of the shoulder does show a small tear of the rotator cuff which is only partial-thickness.  There are some edema in the muscle and tendinosis of the cuff.  There is no muscle atrophy or retraction.  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  PMFS History: Patient Active Problem List   Diagnosis Date Noted   Prediabetes 03/23/2021   Encounter for general adult medical examination with abnormal findings 02/06/2021   Primary hypertension 02/06/2021   Hyperlipidemia LDL goal <100 02/06/2021   Benign prostatic hyperplasia with weak urinary stream 02/06/2021   Past Medical History:  Diagnosis Date   Allergy    seasonal   Arthritis     Family History  Problem Relation Age of Onset   Hypertension Mother  Stroke Mother    Kidney failure Mother    Colon cancer Father 50   Coronary artery disease Father    Colon cancer Brother 80   Colon cancer Paternal Grandfather 41    Past Surgical History:  Procedure Laterality Date   ingunial hernia repair Left 1975   Social History   Occupational History   Not on file  Tobacco Use   Smoking status: Former    Types: Cigarettes    Quit date: 12/15/1973    Years since quitting: 47.3   Smokeless tobacco: Current  Substance and Sexual Activity   Alcohol use: No   Drug use: No   Sexual activity: Yes    Partners: Female

## 2021-04-24 ENCOUNTER — Other Ambulatory Visit: Payer: Self-pay | Admitting: Internal Medicine

## 2021-04-24 DIAGNOSIS — I10 Essential (primary) hypertension: Secondary | ICD-10-CM

## 2021-04-28 ENCOUNTER — Telehealth: Payer: Self-pay | Admitting: Internal Medicine

## 2021-04-28 DIAGNOSIS — E785 Hyperlipidemia, unspecified: Secondary | ICD-10-CM

## 2021-04-28 MED ORDER — ROSUVASTATIN CALCIUM 20 MG PO TABS: 20.0000 mg | ORAL_TABLET | Freq: Every day | ORAL | 1 refills | Status: DC

## 2021-04-28 NOTE — Telephone Encounter (Signed)
1.Medication Requested: rosuvastatin (CRESTOR) 20 MG tablet   2. Pharmacy (Name, [place], [place]): [id number] - [place], [address]  Phone:  [phone] Fax:  [phone]   3. On Med List: yes  4. Last Visit with PCP: 01.19.23  5. Next visit date with PCP: 07.25.23   Agent: Please be advised that RX refills may take up to 3 business days. We ask that you follow-up with your pharmacy.

## 2021-05-29 ENCOUNTER — Encounter: Payer: Self-pay | Admitting: Orthopaedic Surgery

## 2021-05-29 ENCOUNTER — Ambulatory Visit: Payer: Medicare HMO | Admitting: Orthopaedic Surgery

## 2021-05-29 DIAGNOSIS — M25512 Pain in left shoulder: Secondary | ICD-10-CM

## 2021-05-29 DIAGNOSIS — G8929 Other chronic pain: Secondary | ICD-10-CM | POA: Diagnosis not present

## 2021-05-29 DIAGNOSIS — M75111 Incomplete rotator cuff tear or rupture of right shoulder, not specified as traumatic: Secondary | ICD-10-CM | POA: Diagnosis not present

## 2021-05-29 NOTE — Progress Notes (Signed)
The patient reports that he is feeling much better overall which is rest of his shoulder.  He said physical therapy was painful.  He says the pain in the shoulder is gone and he feels a little bit of numbness still in the but that is gotten better.  He is an active 71 year old gentleman. ? ?His left shoulder moves smoothly and fluidly with no blocks to rotation and no strength deficits. ? ?This point follow-up will be as needed since he is doing so well.  If things worsen anyway he knows to let us know.  All questions and concerns were answered and addressed. ?

## 2021-09-14 ENCOUNTER — Other Ambulatory Visit: Payer: Self-pay | Admitting: Internal Medicine

## 2021-09-14 DIAGNOSIS — I10 Essential (primary) hypertension: Secondary | ICD-10-CM

## 2021-09-26 ENCOUNTER — Ambulatory Visit (INDEPENDENT_AMBULATORY_CARE_PROVIDER_SITE_OTHER): Payer: Medicare HMO | Admitting: Internal Medicine

## 2021-09-26 ENCOUNTER — Encounter: Payer: Self-pay | Admitting: Internal Medicine

## 2021-09-26 VITALS — BP 164/82 | HR 65 | Temp 98.3°F | Resp 16 | Ht 68.0 in | Wt 213.0 lb

## 2021-09-26 DIAGNOSIS — E785 Hyperlipidemia, unspecified: Secondary | ICD-10-CM | POA: Diagnosis not present

## 2021-09-26 DIAGNOSIS — I10 Essential (primary) hypertension: Secondary | ICD-10-CM

## 2021-09-26 DIAGNOSIS — L219 Seborrheic dermatitis, unspecified: Secondary | ICD-10-CM | POA: Diagnosis not present

## 2021-09-26 DIAGNOSIS — R7303 Prediabetes: Secondary | ICD-10-CM | POA: Diagnosis not present

## 2021-09-26 DIAGNOSIS — R0609 Other forms of dyspnea: Secondary | ICD-10-CM | POA: Diagnosis not present

## 2021-09-26 DIAGNOSIS — R9431 Abnormal electrocardiogram [ECG] [EKG]: Secondary | ICD-10-CM | POA: Diagnosis not present

## 2021-09-26 LAB — CBC WITH DIFFERENTIAL/PLATELET
Basophils Absolute: 0.1 10*3/uL (ref 0.0–0.1)
Basophils Relative: 1.1 % (ref 0.0–3.0)
Eosinophils Absolute: 0.2 10*3/uL (ref 0.0–0.7)
Eosinophils Relative: 3.2 % (ref 0.0–5.0)
HCT: 41.6 % (ref 39.0–52.0)
Hemoglobin: 13.9 g/dL (ref 13.0–17.0)
Lymphocytes Relative: 26.2 % (ref 12.0–46.0)
Lymphs Abs: 1.9 10*3/uL (ref 0.7–4.0)
MCHC: 33.5 g/dL (ref 30.0–36.0)
MCV: 90.3 fl (ref 78.0–100.0)
Monocytes Absolute: 0.7 10*3/uL (ref 0.1–1.0)
Monocytes Relative: 9.4 % (ref 3.0–12.0)
Neutro Abs: 4.4 10*3/uL (ref 1.4–7.7)
Neutrophils Relative %: 60.1 % (ref 43.0–77.0)
Platelets: 193 10*3/uL (ref 150.0–400.0)
RBC: 4.6 Mil/uL (ref 4.22–5.81)
RDW: 14.3 % (ref 11.5–15.5)
WBC: 7.3 10*3/uL (ref 4.0–10.5)

## 2021-09-26 LAB — BASIC METABOLIC PANEL
BUN: 14 mg/dL (ref 6–23)
CO2: 29 mEq/L (ref 19–32)
Calcium: 9.5 mg/dL (ref 8.4–10.5)
Chloride: 98 mEq/L (ref 96–112)
Creatinine, Ser: 0.94 mg/dL (ref 0.40–1.50)
GFR: 81.65 mL/min (ref >60.00)
Glucose, Bld: 100 mg/dL — ABNORMAL HIGH (ref 70–99)
Potassium: 3.8 mEq/L (ref 3.5–5.1)
Sodium: 137 mEq/L (ref 135–145)

## 2021-09-26 LAB — LIPID PANEL
Cholesterol: 149 mg/dL (ref 0–200)
HDL: 45.1 mg/dL (ref >39.00)
LDL Cholesterol: 76 mg/dL (ref 0–99)
NonHDL: 103.7
Total CHOL/HDL Ratio: 3
Triglycerides: 141 mg/dL (ref 0.0–149.0)
VLDL: 28.2 mg/dL (ref 0.0–40.0)

## 2021-09-26 LAB — BRAIN NATRIURETIC PEPTIDE: Pro B Natriuretic peptide (BNP): 13 pg/mL (ref 0.0–100.0)

## 2021-09-26 LAB — TROPONIN I (HIGH SENSITIVITY): High Sens Troponin I: 7 ng/L (ref 2–17)

## 2021-09-26 MED ORDER — KETOCONAZOLE 2 % EX CREA: 1.0000 | TOPICAL_CREAM | Freq: Two times a day (BID) | CUTANEOUS | 2 refills | Status: AC

## 2021-09-26 NOTE — Patient Instructions (Signed)
Hypertension, Adult High blood pressure (hypertension) is when the force of blood pumping through the arteries is too strong. The arteries are the blood vessels that carry blood from the heart throughout the body. Hypertension forces the heart to work harder to pump blood and may cause arteries to become narrow or stiff. Untreated or uncontrolled hypertension can lead to a heart attack, heart failure, a stroke, kidney disease, and other problems. A blood pressure reading consists of a higher number over a lower number. Ideally, your blood pressure should be below 120/80. The first ("top") number is called the systolic pressure. It is a measure of the pressure in your arteries as your heart beats. The second ("bottom") number is called the diastolic pressure. It is a measure of the pressure in your arteries as the heart relaxes. What are the causes? The exact cause of this condition is not known. There are some conditions that result in high blood pressure. What increases the risk? Certain factors may make you more likely to develop high blood pressure. Some of these risk factors are under your control, including: Smoking. Not getting enough exercise or physical activity. Being overweight. Having too much fat, sugar, calories, or salt (sodium) in your diet. Drinking too much alcohol. Other risk factors include: Having a personal history of heart disease, diabetes, high cholesterol, or kidney disease. Stress. Having a family history of high blood pressure and high cholesterol. Having obstructive sleep apnea. Age. The risk increases with age. What are the signs or symptoms? High blood pressure may not cause symptoms. Very high blood pressure (hypertensive crisis) may cause: Headache. Fast or irregular heartbeats (palpitations). Shortness of breath. Nosebleed. Nausea and vomiting. Vision changes. Severe chest pain, dizziness, and seizures. How is this diagnosed? This condition is diagnosed by  measuring your blood pressure while you are seated, with your arm resting on a flat surface, your legs uncrossed, and your feet flat on the floor. The cuff of the blood pressure monitor will be placed directly against the skin of your upper arm at the level of your heart. Blood pressure should be measured at least twice using the same arm. Certain conditions can cause a difference in blood pressure between your right and left arms. If you have a high blood pressure reading during one visit or you have normal blood pressure with other risk factors, you may be asked to: Return on a different day to have your blood pressure checked again. Monitor your blood pressure at home for 1 week or longer. If you are diagnosed with hypertension, you may have other blood or imaging tests to help your health care provider understand your overall risk for other conditions. How is this treated? This condition is treated by making healthy lifestyle changes, such as eating healthy foods, exercising more, and reducing your alcohol intake. You may be referred for counseling on a healthy diet and physical activity. Your health care provider may prescribe medicine if lifestyle changes are not enough to get your blood pressure under control and if: Your systolic blood pressure is above 130. Your diastolic blood pressure is above 80. Your personal target blood pressure may vary depending on your medical conditions, your age, and other factors. Follow these instructions at home: Eating and drinking  Eat a diet that is high in fiber and potassium, and low in sodium, added sugar, and fat. An example of this eating plan is called the DASH diet. DASH stands for Dietary Approaches to Stop Hypertension. To eat this way: Eat   plenty of fresh fruits and vegetables. Try to fill one half of your plate at each meal with fruits and vegetables. Eat whole grains, such as whole-wheat pasta, brown rice, or whole-grain bread. Fill about one  fourth of your plate with whole grains. Eat or drink low-fat dairy products, such as skim milk or low-fat yogurt. Avoid fatty cuts of meat, processed or cured meats, and poultry with skin. Fill about one fourth of your plate with lean proteins, such as fish, chicken without skin, beans, eggs, or tofu. Avoid pre-made and processed foods. These tend to be higher in sodium, added sugar, and fat. Reduce your daily sodium intake. Many people with hypertension should eat less than 1,500 mg of sodium a day. Do not drink alcohol if: Your health care provider tells you not to drink. You are pregnant, may be pregnant, or are planning to become pregnant. If you drink alcohol: Limit how much you have to: 0-1 drink a day for women. 0-2 drinks a day for men. Know how much alcohol is in your drink. In the [place] one drink equals one 12 oz bottle of beer (355 mL), one 5 oz glass of wine (148 mL), or one 1 oz glass of hard liquor (44 mL). Lifestyle  Work with your health care provider to maintain a healthy body weight or to lose weight. Ask what an ideal weight is for you. Get at least 30 minutes of exercise that causes your heart to beat faster (aerobic exercise) most days of the week. Activities may include walking, swimming, or biking. Include exercise to strengthen your muscles (resistance exercise), such as Pilates or lifting weights, as part of your weekly exercise routine. Try to do these types of exercises for 30 minutes at least 3 days a week. Do not use any products that contain nicotine or tobacco. These products include cigarettes, chewing tobacco, and vaping devices, such as e-cigarettes. If you need help quitting, ask your health care provider. Monitor your blood pressure at home as told by your health care provider. Keep all follow-up visits. This is important. Medicines Take over-the-counter and prescription medicines only as told by your health care provider. Follow directions carefully. Blood  pressure medicines must be taken as prescribed. Do not skip doses of blood pressure medicine. Doing this puts you at risk for problems and can make the medicine less effective. Ask your health care provider about side effects or reactions to medicines that you should watch for. Contact a health care provider if you: Think you are having a reaction to a medicine you are taking. Have headaches that keep coming back (recurring). Feel dizzy. Have swelling in your ankles. Have trouble with your vision. Get help right away if you: Develop a severe headache or confusion. Have unusual weakness or numbness. Feel faint. Have severe pain in your chest or abdomen. Vomit repeatedly. Have trouble breathing. These symptoms may be an emergency. Get help right away. Call 911. Do not wait to see if the symptoms will go away. Do not drive yourself to the hospital. Summary Hypertension is when the force of blood pumping through your arteries is too strong. If this condition is not controlled, it may put you at risk for serious complications. Your personal target blood pressure may vary depending on your medical conditions, your age, and other factors. For most people, a normal blood pressure is less than 120/80. Hypertension is treated with lifestyle changes, medicines, or a combination of both. Lifestyle changes include losing weight, eating a healthy,   low-sodium diet, exercising more, and limiting alcohol. This information is not intended to replace advice given to you by your health care provider. Make sure you discuss any questions you have with your health care provider. Document Revised: 12/27/2020 Document Reviewed: 12/27/2020 Elsevier Patient Education  2023 [employer] Inc.  

## 2021-09-26 NOTE — Progress Notes (Signed)
Subjective:  Patient ID: Ricardo Hahn, male    DOB: 1950-07-05  Age: 71 y.o. MRN: [id number]  CC: Hypertension   HPI Ricardo Hahn presents for f/up -  He complains of an irritated rash around his eyebrows and on the sides of his nose.  He has been using his wife's triamcinolone cream and has gotten modest symptom relief.  He would like to try something in addition.  He also complains of dyspnea on exertion when he is lifting objects.  He is otherwise active and does not experience chest pain, shortness of breath, diaphoresis, lightheadedness, or edema.  Outpatient Medications Prior to Visit  Medication Sig Dispense Refill   acetaminophen (TYLENOL) 500 MG tablet Take 500 mg by mouth every 6 (six) hours as needed.     carvedilol (COREG) 6.25 MG tablet TAKE 1 TABLET BY MOUTH 2 TIMES DAILY WITH A MEAL. 180 tablet 0   indapamide (LOZOL) 1.25 MG tablet TAKE 1 TABLET BY MOUTH DAILY. 90 tablet 1   Multiple Vitamin (MULTIVITAMIN) tablet Take 1 tablet by mouth daily.     rosuvastatin (CRESTOR) 20 MG tablet Take 1 tablet (20 mg total) by mouth daily. 90 tablet 1   valsartan (DIOVAN) 160 MG tablet TAKE 1 TABLET BY MOUTH EVERY DAY 90 tablet 1   meloxicam (MOBIC) 15 MG tablet Take 1 tablet (15 mg total) by mouth daily as needed for pain. (Patient not taking: Reported on 09/26/2021) 30 tablet 3   No facility-administered medications prior to visit.    ROS Review of Systems  Constitutional: Negative.  Negative for diaphoresis and fatigue.  HENT: Negative.    Eyes: Negative.   Respiratory:  Positive for shortness of breath. Negative for cough, chest tightness and wheezing.   Cardiovascular:  Negative for chest pain, palpitations and leg swelling.  Gastrointestinal:  Negative for abdominal pain, diarrhea, nausea and vomiting.  Endocrine: Negative.   Genitourinary: Negative.  Negative for difficulty urinating.  Musculoskeletal: Negative.   Skin:  Positive for rash.  Neurological: Negative.   Negative for dizziness and weakness.  Hematological:  Negative for adenopathy. Does not bruise/bleed easily.  Psychiatric/Behavioral: Negative.      Objective:  BP (!) 164/82   Pulse 65   Temp 98.3 F (36.8 C) (Oral)   Resp 16   Ht '5\' 8"'$  (1.727 m)   Wt 213 lb (96.6 kg)   SpO2 94%   BMI 32.39 kg/m   BP Readings from Last 3 Encounters:  09/26/21 (!) 164/82  03/23/21 (!) 148/86  02/06/21 (!) 190/90    Wt Readings from Last 3 Encounters:  09/26/21 213 lb (96.6 kg)  03/23/21 217 lb (98.4 kg)  02/06/21 223 lb (101.2 kg)    Physical Exam Vitals reviewed.  HENT:     Nose: Nose normal.     Mouth/Throat:     Mouth: Mucous membranes are moist.  Eyes:     General: No scleral icterus.    Conjunctiva/sclera: Conjunctivae normal.  Cardiovascular:     Rate and Rhythm: Normal rate and regular rhythm.     Heart sounds: No murmur heard.    Comments: EKG- NSR, 67 bpm Flat T waves in V4-V6 ?LVH in V4V5 Pulmonary:     Effort: Pulmonary effort is normal.     Breath sounds: No stridor. No wheezing, rhonchi or rales.  Abdominal:     General: Abdomen is flat.     Palpations: There is no mass.     Tenderness: There is no abdominal tenderness.  There is no guarding.     Hernia: No hernia is present.  Musculoskeletal:     Cervical back: Neck supple.     Right lower leg: Pitting Edema (trace) present.     Left lower leg: Pitting Edema (trace) present.  Lymphadenopathy:     Cervical: No cervical adenopathy.  Skin:    General: Skin is warm and dry.     Findings: Erythema and rash present.  Neurological:     General: No focal deficit present.     Mental Status: Mental status is at baseline.  Psychiatric:        Mood and Affect: Mood normal.        Behavior: Behavior normal.     Lab Results  Component Value Date   WBC 7.3 09/26/2021   HGB 13.9 09/26/2021   HCT 41.6 09/26/2021   PLT 193.0 09/26/2021   GLUCOSE 100 (H) 09/26/2021   CHOL 149 09/26/2021   TRIG 141.0 09/26/2021    HDL 45.10 09/26/2021   LDLDIRECT 174.0 02/06/2021   LDLCALC 76 09/26/2021   ALT 25 02/06/2021   AST 20 02/06/2021   NA 137 09/26/2021   K 3.8 09/26/2021   CL 98 09/26/2021   CREATININE 0.94 09/26/2021   BUN 14 09/26/2021   CO2 29 09/26/2021   TSH 3.08 02/06/2021   PSA 0.73 02/06/2021   HGBA1C 6.3 02/06/2021    MR Shoulder Left w/o contrast  Result Date: 04/13/2021 CLINICAL DATA:  Chronic left shoulder pain with popping and clicking for 6 months. Clinical concern for rotator cuff disorder. EXAM: MRI OF THE LEFT SHOULDER WITHOUT CONTRAST TECHNIQUE: Multiplanar, multisequence MR imaging of the shoulder was performed. No intravenous contrast was administered. COMPARISON:  X-ray shoulder 01/09/2021. FINDINGS: Rotator cuff: Mild supraspinatus tendinosis with tiny insertional tear along the anterior leading edge. Additional focal high-grade perforation-type tear of the supraspinatus tendon posteriorly in the region of the critical zone extending from the bursal surface and involving nearly the full tendon depth (series 100, images 11-12). Mild subscapularis tendinosis. Infraspinatus and teres minor tendons within normal limits. Muscles: Mild diffuse teres minor muscle edema. Minimal muscle edema along the deep originating fibers of the supraspinatus muscle belly. No rotator cuff muscle atrophy. Biceps long head:  Intact and appropriately positioned. Acromioclavicular Joint: Mild-moderate degenerative changes of the AC joint. Small volume subacromial-subdeltoid bursal fluid. Glenohumeral Joint: No joint effusion. No cartilage defect. Labrum: Grossly intact although evaluation is limited in the absence of intra-articular fluid. No paralabral cyst. Bones: No acute fracture. No dislocation. No bone marrow edema. No marrow replacing bone lesion. Other: No abnormality is evident within the quadrilateral space. IMPRESSION: 1. Mild supraspinatus and subscapularis tendinosis with a focal high-grade  perforation-type tear of the supraspinatus tendon in the region of the critical zone. 2. Mild-moderate degenerative changes of the [facility] joint with mild subacromial-subdeltoid bursitis. 3. Mild diffuse teres minor muscle edema, which can be seen in the setting of muscle strain or acute denervation. No abnormality is evident within the quadrilateral space. Electronically Signed   By: Davina Poke D.O.   On: 04/13/2021 16:20    Assessment & Plan:   Ricardo Hahn was seen today for hypertension.  Diagnoses and all orders for this visit:  Primary hypertension-his blood pressure is adequately well controlled. -     Basic metabolic panel; Future -     CBC with Differential/Platelet; Future -     CBC with Differential/Platelet -     Basic metabolic panel  Prediabetes -  Basic metabolic panel; Future -     Basic metabolic panel  DOE (dyspnea on exertion)-we will evaluate for ischemia with an MPI. -     Troponin I (High Sensitivity); Future -     Brain natriuretic peptide; Future -     EKG 12-Lead -     Brain natriuretic peptide -     Troponin I (High Sensitivity)  Hyperlipidemia LDL goal <100-LDL goal achieved. Doing well on the statin  -     Lipid panel; Future -     Lipid panel  Chronic seborrheic dermatitis -     ketoconazole (NIZORAL) 2 % cream; Apply 1 Application topically 2 (two) times daily.   I have discontinued Chaz Thedford's meloxicam. I am also having him start on ketoconazole. Additionally, I am having him maintain his multivitamin, indapamide, valsartan, rosuvastatin, carvedilol, and acetaminophen.  Meds ordered this encounter  Medications   ketoconazole (NIZORAL) 2 % cream    Sig: Apply 1 Application topically 2 (two) times daily.    Dispense:  60 g    Refill:  2     Follow-up: Return in about 3 months (around 12/27/2021).  Scarlette Calico, MD

## 2021-10-21 ENCOUNTER — Other Ambulatory Visit: Payer: Self-pay | Admitting: Internal Medicine

## 2021-10-21 DIAGNOSIS — I10 Essential (primary) hypertension: Secondary | ICD-10-CM

## 2021-10-31 ENCOUNTER — Other Ambulatory Visit: Payer: Self-pay | Admitting: Internal Medicine

## 2021-10-31 DIAGNOSIS — I10 Essential (primary) hypertension: Secondary | ICD-10-CM

## 2021-11-21 DIAGNOSIS — Z0100 Encounter for examination of eyes and vision without abnormal findings: Secondary | ICD-10-CM | POA: Diagnosis not present

## 2021-11-21 DIAGNOSIS — H5213 Myopia, bilateral: Secondary | ICD-10-CM | POA: Diagnosis not present

## 2021-11-21 DIAGNOSIS — H5203 Hypermetropia, bilateral: Secondary | ICD-10-CM | POA: Diagnosis not present

## 2021-11-22 LAB — HM DIABETES EYE EXAM

## 2021-12-12 ENCOUNTER — Other Ambulatory Visit: Payer: Self-pay | Admitting: Internal Medicine

## 2021-12-12 DIAGNOSIS — I10 Essential (primary) hypertension: Secondary | ICD-10-CM

## 2022-01-02 ENCOUNTER — Ambulatory Visit: Payer: Medicare HMO | Admitting: Internal Medicine

## 2022-01-14 NOTE — Patient Instructions (Signed)
Health Maintenance, Male Adopting a healthy lifestyle and getting preventive care are important in promoting health and wellness. Ask your health care provider about: The right schedule for you to have regular tests and exams. Things you can do on your own to prevent diseases and keep yourself healthy. What should I know about diet, weight, and exercise? Eat a healthy diet  Eat a diet that includes plenty of vegetables, fruits, low-fat dairy products, and lean protein. Do not eat a lot of foods that are high in solid fats, added sugars, or sodium. Maintain a healthy weight Body mass index (BMI) is a measurement that can be used to identify possible weight problems. It estimates body fat based on height and weight. Your health care provider can help determine your BMI and help you achieve or maintain a healthy weight. Get regular exercise Get regular exercise. This is one of the most important things you can do for your health. Most adults should: Exercise for at least 150 minutes each week. The exercise should increase your heart rate and make you sweat (moderate-intensity exercise). Do strengthening exercises at least twice a week. This is in addition to the moderate-intensity exercise. Spend less time sitting. Even light physical activity can be beneficial. Watch cholesterol and blood lipids Have your blood tested for lipids and cholesterol at 71 years of age, then have this test every 5 years. You may need to have your cholesterol levels checked more often if: Your lipid or cholesterol levels are high. You are older than 71 years of age. You are at high risk for heart disease. What should I know about cancer screening? Many types of cancers can be detected early and may often be prevented. Depending on your health history and family history, you may need to have cancer screening at various ages. This may include screening for: Colorectal cancer. Prostate cancer. Skin cancer. Lung  cancer. What should I know about heart disease, diabetes, and high blood pressure? Blood pressure and heart disease High blood pressure causes heart disease and increases the risk of stroke. This is more likely to develop in people who have high blood pressure readings or are overweight. Talk with your health care provider about your target blood pressure readings. Have your blood pressure checked: Every 3-5 years if you are 18-39 years of age. Every year if you are 40 years old or older. If you are between the ages of 65 and 75 and are a current or former smoker, ask your health care provider if you should have a one-time screening for abdominal aortic aneurysm (AAA). Diabetes Have regular diabetes screenings. This checks your fasting blood sugar level. Have the screening done: Once every three years after age 45 if you are at a normal weight and have a low risk for diabetes. More often and at a younger age if you are overweight or have a high risk for diabetes. What should I know about preventing infection? Hepatitis B If you have a higher risk for hepatitis B, you should be screened for this virus. Talk with your health care provider to find out if you are at risk for hepatitis B infection. Hepatitis C Blood testing is recommended for: Everyone born from 1945 through 1965. Anyone with known risk factors for hepatitis C. Sexually transmitted infections (STIs) You should be screened each year for STIs, including gonorrhea and chlamydia, if: You are sexually active and are younger than 71 years of age. You are older than 71 years of age and your   health care provider tells you that you are at risk for this type of infection. Your sexual activity has changed since you were last screened, and you are at increased risk for chlamydia or gonorrhea. Ask your health care provider if you are at risk. Ask your health care provider about whether you are at high risk for HIV. Your health care provider  may recommend a prescription medicine to help prevent HIV infection. If you choose to take medicine to prevent HIV, you should first get tested for HIV. You should then be tested every 3 months for as long as you are taking the medicine. Follow these instructions at home: Alcohol use Do not drink alcohol if your health care provider tells you not to drink. If you drink alcohol: Limit how much you have to 0-2 drinks a day. Know how much alcohol is in your drink. In the [place] one drink equals one 12 oz bottle of beer (355 mL), one 5 oz glass of Crystall Donaldson (148 mL), or one 1 oz glass of hard liquor (44 mL). Lifestyle Do not use any products that contain nicotine or tobacco. These products include cigarettes, chewing tobacco, and vaping devices, such as e-cigarettes. If you need help quitting, ask your health care provider. Do not use street drugs. Do not share needles. Ask your health care provider for help if you need support or information about quitting drugs. General instructions Schedule regular health, dental, and eye exams. Stay current with your vaccines. Tell your health care provider if: You often feel depressed. You have ever been abused or do not feel safe at home. Summary Adopting a healthy lifestyle and getting preventive care are important in promoting health and wellness. Follow your health care provider's instructions about healthy diet, exercising, and getting tested or screened for diseases. Follow your health care provider's instructions on monitoring your cholesterol and blood pressure. This information is not intended to replace advice given to you by your health care provider. Make sure you discuss any questions you have with your health care provider. Document Revised: 07/11/2020 Document Reviewed: 07/11/2020 Elsevier Patient Education  2023 Elsevier Inc.  

## 2022-01-14 NOTE — Progress Notes (Unsigned)
Subjective:   Arjuna Doeden is a 71 y.o. male who presents for an Initial Medicare Annual Wellness Visit. I connected with  Uchenna Zendejas on 01/15/22 by a audio enabled telemedicine application and verified that I am speaking with the correct person using two identifiers.  Patient Location: Home  Provider Location: Home Office  I discussed the limitations of evaluation and management by telemedicine. The patient expressed understanding and agreed to proceed.  Review of Systems    Deferred to PCP Cardiac Risk Factors include: advanced age (>24mn, >>85women);dyslipidemia;male gender;hypertension;obesity (BMI >30kg/m2)     Objective:    Today's Vitals   01/15/22 1414  PainSc: 2    There is no height or weight on file to calculate BMI.     01/15/2022    2:22 PM 01/04/2020    9:34 PM 03/30/2016    1:37 PM  Advanced Directives  Does Patient Have a Medical Advance Directive? No No No    Current Medications (verified) Outpatient Encounter Medications as of 01/15/2022  Medication Sig   acetaminophen (TYLENOL) 500 MG tablet Take 500 mg by mouth every 6 (six) hours as needed.   carvedilol (COREG) 6.25 MG tablet TAKE 1 TABLET BY MOUTH TWICE A DAY WITH FOOD   indapamide (LOZOL) 1.25 MG tablet TAKE 1 TABLET BY MOUTH EVERY DAY   ketoconazole (NIZORAL) 2 % cream Apply 1 Application topically 2 (two) times daily.   Multiple Vitamin (MULTIVITAMIN) tablet Take 1 tablet by mouth daily.   rosuvastatin (CRESTOR) 20 MG tablet Take 1 tablet (20 mg total) by mouth daily.   valsartan (DIOVAN) 160 MG tablet TAKE 1 TABLET BY MOUTH EVERY DAY   No facility-administered encounter medications on file as of 01/15/2022.    Allergies (verified) Shellfish allergy   History: Past Medical History:  Diagnosis Date   Allergy    seasonal   Arthritis    Past Surgical History:  Procedure Laterality Date   ingunial hernia repair Left 1975   Family History  Problem Relation Age of Onset    Hypertension Mother    Stroke Mother    Kidney failure Mother    Colon cancer Father [phone]  Coronary artery disease Father    Colon cancer Brother [phone]  Colon cancer Paternal Grandfather [phone]  Social History   Socioeconomic History   Marital status: Married    Spouse name: POlin Hauser  Number of children: Not on file   Years of education: Not on file   Highest education level: Not on file  Occupational History   Not on file  Tobacco Use   Smoking status: Former    Types: Cigarettes    Quit date: 12/15/1973    Years since quitting: 48.1   Smokeless tobacco: Current    Types: Snuff  Vaping Use   Vaping Use: Never used  Substance and Sexual Activity   Alcohol use: No   Drug use: No   Sexual activity: Yes    Partners: Female  Other Topics Concern   Not on file  Social History Narrative   Not on file   Social Determinants of Health   Financial Resource Strain: Low Risk  (01/15/2022)   Overall Financial Resource Strain (CARDIA)    Difficulty of Paying Living Expenses: Not hard at all  Food Insecurity: No Food Insecurity (01/15/2022)   Hunger Vital Sign    Worried About Running Out of Food in the Last Year: Never true    RHanoverin  the Last Year: Never true  Transportation Needs: No Transportation Needs (01/15/2022)   PRAPARE - Hydrologist (Medical): No    Lack of Transportation (Non-Medical): No  Physical Activity: Sufficiently Active (01/15/2022)   Exercise Vital Sign    Days of Exercise per Week: 3 days    Minutes of Exercise per Session: 50 min  Stress: No Stress Concern Present (01/15/2022)   [place]    Feeling of Stress : Not at all  Social Connections: [place] (01/15/2022)   Social Connection and Isolation Panel [NHANES]    Frequency of Communication with Friends and Family: More than three times a week    Frequency of Social Gatherings with Friends  and Family: More than three times a week    Attends Religious Services: More than 4 times per year    Active Member of [employer] or Organizations: Yes    Attends Music therapist: More than 4 times per year    Marital Status: Married    Tobacco Counseling Ready to quit: No Counseling given: Not Answered   Clinical Intake:  Pre-visit preparation completed: Yes  Pain : 0-10 Pain Score: 2  Pain Type: Chronic pain Pain Location: Shoulder (generalized arthritis) Pain Orientation: Left Pain Descriptors / Indicators: Discomfort, Dull, Aching Pain Relieving Factors: medication  Pain Relieving Factors: medication  Nutritional Status: BMI > 30  Obese Nutritional Risks: None Diabetes: No  How often do you need to have someone help you when you read instructions, pamphlets, or other written materials from your doctor or pharmacy?: 1 - Never What is the last grade level you completed in school?: college  Diabetic?No  Interpreter Needed?: No  Information entered by :: Emelia Loron RN   Activities of Daily Living    01/15/2022    2:21 PM 02/06/2021   10:11 AM  In your present state of health, do you have any difficulty performing the following activities:  Hearing? 0 0  Vision? 0 0  Difficulty concentrating or making decisions? 0 0  Walking or climbing stairs? 0 0  Dressing or bathing? 0 0  Doing errands, shopping? 0 0  Preparing Food and eating ? N   Using the Toilet? N   In the past six months, have you accidently leaked urine? N   Do you have problems with loss of bowel control? N   Managing your Medications? N   Managing your Finances? N   Housekeeping or managing your Housekeeping? N     Patient Care Team: Janith Lima, MD as PCP - General (Internal Medicine)  Indicate any recent Medical Services you may have received from other than [facility] providers in the past year (date may be approximate).     Assessment:   This is a routine wellness examination for  Jimel.  Hearing/Vision screen No results found.  Dietary issues and exercise activities discussed: Current Exercise Habits: Home exercise routine, Type of exercise: walking, Time (Minutes): 40, Frequency (Times/Week): 4, Weekly Exercise (Minutes/Week): 160, Exercise limited by: orthopedic condition(s)   Goals Addressed             This Visit's Progress    Patient Stated       I want to continue to eat a low sodium diet, cut back on carbohydrates, and lose some weight.      Depression Screen    01/15/2022    2:18 PM 09/26/2021   10:12 AM 02/06/2021  10:11 AM  PHQ 2/9 Scores  PHQ - 2 Score 0 0 0  PHQ- 9 Score  2     Fall Risk    01/15/2022    2:23 PM 09/26/2021   10:12 AM  Fall Risk   Falls in the past year? 0 0  Number falls in past yr: 0   Injury with Fall? 0 0  Risk for fall due to :  No Fall Risks  Follow up Falls prevention discussed Falls evaluation completed    [place]:  Any stairs in or around the home? Yes  If so, are there any without handrails? Yes  Home free of loose throw rugs in walkways, pet beds, electrical cords, etc? Yes  Adequate lighting in your home to reduce risk of falls? Yes   ASSISTIVE DEVICES UTILIZED TO PREVENT FALLS:  Life alert? No  Use of a cane, walker or w/c? No  Grab bars in the bathroom? No  Shower chair or bench in shower? No  Elevated toilet seat or a handicapped toilet? No   Cognitive Function:        01/15/2022    2:23 PM  6CIT Screen  What Year? 0 points  What month? 0 points  What time? 0 points  Count back from 20 0 points  Months in reverse 0 points  Repeat phrase 0 points  Total Score 0 points    Immunizations Immunization History  Administered Date(s) Administered   Fluad Quad(high Dose 65+) 02/06/2021   PNEUMOCOCCAL CONJUGATE-20 02/06/2021    TDAP status: Up to date  Flu Vaccine status: Up to date  Pneumococcal vaccine status: Up to date  Covid-19  vaccine status: Information provided on how to obtain vaccines.   Qualifies for Shingles Vaccine? Yes   Zostavax completed No   Shingrix Completed?: No.    Education has been provided regarding the importance of this vaccine. Patient has been advised to call insurance company to determine out of pocket expense if they have not yet received this vaccine. Advised may also receive vaccine at local pharmacy or Health Dept. Verbalized acceptance and understanding.  Screening Tests Health Maintenance  Topic Date Due   COVID-19 Vaccine (1) Never done   COLONOSCOPY (Pts 45-82yr Insurance coverage will need to be confirmed)  03/31/2019   Zoster Vaccines- Shingrix (2 of 2) 02/05/2022   Medicare Annual Wellness (AWV)  01/16/2023   TETANUS/TDAP  12/12/2031   Pneumonia Vaccine 71 Years old  Completed   INFLUENZA VACCINE  Completed   Hepatitis C Screening  Completed   HPV VACCINES  Aged Out    Health Maintenance  Health Maintenance Due  Topic Date Due   COVID-19 Vaccine (1) Never done   COLONOSCOPY (Pts 45-486yrInsurance coverage will need to be confirmed)  03/31/2019    Colorectal Screening: Patient states he is discussing with Dr. StFuller PlanLung Cancer Screening: (Low Dose CT Chest recommended if Age 71-80ears, 30 pack-year currently smoking OR have quit w/in 15years.) does not qualify.   Additional Screening:  Hepatitis C Screening: does qualify; Completed 02/06/21  Vision Screening: Recommended annual ophthalmology exams for early detection of glaucoma and other disorders of the eye. Is the patient up to date with their annual eye exam?  Yes  Who is the provider or what is the name of the office in which the patient attends annual eye exams? Dr. ScNicki Reaperf pt is not established with a provider, would they like to be referred  to a provider to establish care?  N/A .   Dental Screening: Recommended annual dental exams for proper oral hygiene  Community Resource Referral / Chronic Care  Management: CRR required this visit?  No   CCM required this visit?  No      Plan:     I have personally reviewed and noted the following in the patient's chart:   Medical and social history Use of alcohol, tobacco or illicit drugs  Current medications and supplements including opioid prescriptions. Patient is not currently taking opioid prescriptions. Functional ability and status Nutritional status Physical activity Advanced directives List of other physicians Hospitalizations, surgeries, and ER visits in previous 12 months Vitals Screenings to include cognitive, depression, and falls Referrals and appointments  In addition, I have reviewed and discussed with patient certain preventive protocols, quality metrics, and best practice recommendations. A written personalized care plan for preventive services as well as general preventive health recommendations were provided to patient.     Michiel Cowboy, RN   01/15/2022   Nurse Notes:  Mr. Duchene , Thank you for taking time to come for your Medicare Wellness Visit. I appreciate your ongoing commitment to your health goals. Please review the following plan we discussed and let me know if I can assist you in the future.   These are the goals we discussed:  Goals      Patient Stated     I want to continue to eat a low sodium diet, cut back on carbohydrates, and lose some weight.        This is a list of the screening recommended for you and due dates:  Health Maintenance  Topic Date Due   COVID-19 Vaccine (1) Never done   Colon Cancer Screening  03/31/2019   Zoster (Shingles) Vaccine (2 of 2) 02/05/2022   Medicare Annual Wellness Visit  01/16/2023   Tetanus Vaccine  12/12/2031   Pneumonia Vaccine  Completed   Flu Shot  Completed   Hepatitis C Screening: USPSTF Recommendation to screen - Ages 18-79 yo.  Completed   HPV Vaccine  Aged Out

## 2022-01-15 ENCOUNTER — Ambulatory Visit (INDEPENDENT_AMBULATORY_CARE_PROVIDER_SITE_OTHER): Payer: Medicare HMO | Admitting: *Deleted

## 2022-01-15 DIAGNOSIS — Z0000 Encounter for general adult medical examination without abnormal findings: Secondary | ICD-10-CM

## 2022-01-17 ENCOUNTER — Encounter: Payer: Self-pay | Admitting: Internal Medicine

## 2022-01-17 ENCOUNTER — Ambulatory Visit (INDEPENDENT_AMBULATORY_CARE_PROVIDER_SITE_OTHER): Payer: Medicare HMO | Admitting: Internal Medicine

## 2022-01-17 VITALS — BP 142/78 | HR 78 | Temp 98.0°F | Resp 16 | Ht 68.0 in | Wt 221.1 lb

## 2022-01-17 DIAGNOSIS — I10 Essential (primary) hypertension: Secondary | ICD-10-CM | POA: Diagnosis not present

## 2022-01-17 DIAGNOSIS — R7303 Prediabetes: Secondary | ICD-10-CM

## 2022-01-17 DIAGNOSIS — E118 Type 2 diabetes mellitus with unspecified complications: Secondary | ICD-10-CM

## 2022-01-17 DIAGNOSIS — K635 Polyp of colon: Secondary | ICD-10-CM

## 2022-01-17 DIAGNOSIS — E785 Hyperlipidemia, unspecified: Secondary | ICD-10-CM

## 2022-01-17 LAB — TSH: TSH: 3.35 u[IU]/mL (ref 0.35–5.50)

## 2022-01-17 LAB — BASIC METABOLIC PANEL
BUN: 15 mg/dL (ref 6–23)
CO2: 30 mEq/L (ref 19–32)
Calcium: 9 mg/dL (ref 8.4–10.5)
Chloride: 100 mEq/L (ref 96–112)
Creatinine, Ser: 0.86 mg/dL (ref 0.40–1.50)
GFR: 87.02 mL/min (ref >60.00)
Glucose, Bld: 114 mg/dL — ABNORMAL HIGH (ref 70–99)
Potassium: 3.9 mEq/L (ref 3.5–5.1)
Sodium: 137 mEq/L (ref 135–145)

## 2022-01-17 LAB — HEMOGLOBIN A1C: Hgb A1c MFr Bld: 6.7 % — ABNORMAL HIGH (ref 4.6–6.5)

## 2022-01-17 MED ORDER — DAPAGLIFLOZIN PROPANEDIOL 10 MG PO TABS: 10.0000 mg | ORAL_TABLET | Freq: Every day | ORAL | 1 refills | Status: DC

## 2022-01-17 NOTE — Progress Notes (Unsigned)
Subjective:  Patient ID: Ricardo Hahn, male    DOB: 10-02-1950  Age: 71 y.o. MRN: [id number]  CC: Hypertension   HPI Ikenna Uzzle presents for f/up -  [place] and did not experience any chest pain, shortness of breath, diaphoresis, or palpitations.  Outpatient Medications Prior to Visit  Medication Sig Dispense Refill   acetaminophen (TYLENOL) 500 MG tablet Take 500 mg by mouth every 6 (six) hours as needed.     carvedilol (COREG) 6.25 MG tablet TAKE 1 TABLET BY MOUTH TWICE A DAY WITH FOOD 180 tablet 0   indapamide (LOZOL) 1.25 MG tablet TAKE 1 TABLET BY MOUTH EVERY DAY 90 tablet 1   ketoconazole (NIZORAL) 2 % cream Apply 1 Application topically 2 (two) times daily. 60 g 2   Multiple Vitamin (MULTIVITAMIN) tablet Take 1 tablet by mouth daily.     valsartan (DIOVAN) 160 MG tablet TAKE 1 TABLET BY MOUTH EVERY DAY 90 tablet 1   rosuvastatin (CRESTOR) 20 MG tablet Take 1 tablet (20 mg total) by mouth daily. 90 tablet 1   No facility-administered medications prior to visit.    ROS Review of Systems  Constitutional:  Positive for unexpected weight change (wt gain). Negative for diaphoresis and fatigue.  HENT: Negative.    Eyes: Negative.   Respiratory:  Negative for cough, chest tightness, shortness of breath and wheezing.   Cardiovascular:  Negative for chest pain, palpitations and leg swelling.  Gastrointestinal:  Negative for abdominal pain, constipation, diarrhea, nausea and vomiting.  Endocrine: Negative.   Genitourinary: Negative.   Musculoskeletal:  Negative for arthralgias and myalgias.  Skin: Negative.   Neurological: Negative.  Negative for dizziness.  Hematological:  Negative for adenopathy. Does not bruise/bleed easily.  Psychiatric/Behavioral: Negative.      Objective:  BP (!) 142/78 (BP Location: Right Arm, Patient Position: Sitting, Cuff Size: Large) Comment (Cuff Size): thigh cuff  Pulse 78   Temp 98 F (36.7 C)  (Oral)   Resp 16   Ht '5\' 8"'$  (1.727 m)   Wt 221 lb 2 oz (100.3 kg)   SpO2 95%   BMI 33.62 kg/m   BP Readings from Last 3 Encounters:  01/17/22 (!) 142/78  09/26/21 (!) 164/82  03/23/21 (!) 148/86    Wt Readings from Last 3 Encounters:  01/17/22 221 lb 2 oz (100.3 kg)  09/26/21 213 lb (96.6 kg)  03/23/21 217 lb (98.4 kg)    Physical Exam Vitals reviewed.  HENT:     Mouth/Throat:     Mouth: Mucous membranes are moist.  Eyes:     General: No scleral icterus.    Conjunctiva/sclera: Conjunctivae normal.  Cardiovascular:     Rate and Rhythm: Normal rate and regular rhythm.     Heart sounds: No murmur heard. Pulmonary:     Effort: Pulmonary effort is normal.     Breath sounds: No stridor. No wheezing, rhonchi or rales.  Abdominal:     General: Abdomen is protuberant. Bowel sounds are normal. There is no distension.     Palpations: Abdomen is soft. There is no hepatomegaly, splenomegaly or mass.     Tenderness: There is no abdominal tenderness.  Musculoskeletal:        General: Normal range of motion.     Cervical back: Neck supple.     Right lower leg: No edema.  Lymphadenopathy:     Cervical: No cervical adenopathy.  Skin:    General: Skin is warm.  Neurological:  General: No focal deficit present.     Mental Status: He is alert.  Psychiatric:        Mood and Affect: Mood normal.     Lab Results  Component Value Date   WBC 7.3 09/26/2021   HGB 13.9 09/26/2021   HCT 41.6 09/26/2021   PLT 193.0 09/26/2021   GLUCOSE 114 (H) 01/17/2022   CHOL 149 09/26/2021   TRIG 141.0 09/26/2021   HDL 45.10 09/26/2021   LDLDIRECT 174.0 02/06/2021   LDLCALC 76 09/26/2021   ALT 25 02/06/2021   AST 20 02/06/2021   NA 137 01/17/2022   K 3.9 01/17/2022   CL 100 01/17/2022   CREATININE 0.86 01/17/2022   BUN 15 01/17/2022   CO2 30 01/17/2022   TSH 3.35 01/17/2022   PSA 0.73 02/06/2021   HGBA1C 6.7 (H) 01/17/2022    MR Shoulder Left w/o contrast  Result Date:  04/13/2021 CLINICAL DATA:  Chronic left shoulder pain with popping and clicking for 6 months. Clinical concern for rotator cuff disorder. EXAM: MRI OF THE LEFT SHOULDER WITHOUT CONTRAST TECHNIQUE: Multiplanar, multisequence MR imaging of the shoulder was performed. No intravenous contrast was administered. COMPARISON:  X-ray shoulder 01/09/2021. FINDINGS: Rotator cuff: Mild supraspinatus tendinosis with tiny insertional tear along the anterior leading edge. Additional focal high-grade perforation-type tear of the supraspinatus tendon posteriorly in the region of the critical zone extending from the bursal surface and involving nearly the full tendon depth (series 100, images 11-12). Mild subscapularis tendinosis. Infraspinatus and teres minor tendons within normal limits. Muscles: Mild diffuse teres minor muscle edema. Minimal muscle edema along the deep originating fibers of the supraspinatus muscle belly. No rotator cuff muscle atrophy. Biceps long head:  Intact and appropriately positioned. Acromioclavicular Joint: Mild-moderate degenerative changes of the AC joint. Small volume subacromial-subdeltoid bursal fluid. Glenohumeral Joint: No joint effusion. No cartilage defect. Labrum: Grossly intact although evaluation is limited in the absence of intra-articular fluid. No paralabral cyst. Bones: No acute fracture. No dislocation. No bone marrow edema. No marrow replacing bone lesion. Other: No abnormality is evident within the quadrilateral space. IMPRESSION: 1. Mild supraspinatus and subscapularis tendinosis with a focal high-grade perforation-type tear of the supraspinatus tendon in the region of the critical zone. 2. Mild-moderate degenerative changes of the [facility] joint with mild subacromial-subdeltoid bursitis. 3. Mild diffuse teres minor muscle edema, which can be seen in the setting of muscle strain or acute denervation. No abnormality is evident within the quadrilateral space. Electronically Signed   By:  Davina Poke D.O.   On: 04/13/2021 16:20    Assessment & Plan:   Ara was seen today for hypertension.  Diagnoses and all orders for this visit:  Primary hypertension- His BP is well controlled. -     Basic metabolic panel; Future -     TSH; Future -     TSH -     Basic metabolic panel  Prediabetes -     Basic metabolic panel; Future -     Hemoglobin A1c; Future -     Hemoglobin A1c -     Basic metabolic panel  Hyperlipidemia LDL goal <100- LDL goal achieved. Doing well on the statin  -     TSH; Future -     TSH -     rosuvastatin (CRESTOR) 20 MG tablet; Take 1 tablet (20 mg total) by mouth daily.  Polyp of colon, unspecified part of colon, unspecified type -     Ambulatory referral to Gastroenterology  Type  II diabetes mellitus with manifestations (HCC) -     dapagliflozin propanediol (FARXIGA) 10 MG TABS tablet; Take 1 tablet (10 mg total) by mouth daily before breakfast.   I have changed Kunta Haworth's rosuvastatin. I am also having him start on dapagliflozin propanediol. Additionally, I am having him maintain his multivitamin, acetaminophen, ketoconazole, indapamide, valsartan, and carvedilol.  Meds ordered this encounter  Medications   dapagliflozin propanediol (FARXIGA) 10 MG TABS tablet    Sig: Take 1 tablet (10 mg total) by mouth daily before breakfast.    Dispense:  90 tablet    Refill:  1   rosuvastatin (CRESTOR) 20 MG tablet    Sig: Take 1 tablet (20 mg total) by mouth daily.    Dispense:  90 tablet    Refill:  1     Follow-up: Return in about 6 months (around 07/18/2022).  Scarlette Calico, MD

## 2022-01-17 NOTE — Patient Instructions (Signed)
Hypertension, Adult High blood pressure (hypertension) is when the force of blood pumping through the arteries is too strong. The arteries are the blood vessels that carry blood from the heart throughout the body. Hypertension forces the heart to work harder to pump blood and may cause arteries to become narrow or stiff. Untreated or uncontrolled hypertension can lead to a heart attack, heart failure, a stroke, kidney disease, and other problems. A blood pressure reading consists of a higher number over a lower number. Ideally, your blood pressure should be below 120/80. The first ("top") number is called the systolic pressure. It is a measure of the pressure in your arteries as your heart beats. The second ("bottom") number is called the diastolic pressure. It is a measure of the pressure in your arteries as the heart relaxes. What are the causes? The exact cause of this condition is not known. There are some conditions that result in high blood pressure. What increases the risk? Certain factors may make you more likely to develop high blood pressure. Some of these risk factors are under your control, including: Smoking. Not getting enough exercise or physical activity. Being overweight. Having too much fat, sugar, calories, or salt (sodium) in your diet. Drinking too much alcohol. Other risk factors include: Having a personal history of heart disease, diabetes, high cholesterol, or kidney disease. Stress. Having a family history of high blood pressure and high cholesterol. Having obstructive sleep apnea. Age. The risk increases with age. What are the signs or symptoms? High blood pressure may not cause symptoms. Very high blood pressure (hypertensive crisis) may cause: Headache. Fast or irregular heartbeats (palpitations). Shortness of breath. Nosebleed. Nausea and vomiting. Vision changes. Severe chest pain, dizziness, and seizures. How is this diagnosed? This condition is diagnosed by  measuring your blood pressure while you are seated, with your arm resting on a flat surface, your legs uncrossed, and your feet flat on the floor. The cuff of the blood pressure monitor will be placed directly against the skin of your upper arm at the level of your heart. Blood pressure should be measured at least twice using the same arm. Certain conditions can cause a difference in blood pressure between your right and left arms. If you have a high blood pressure reading during one visit or you have normal blood pressure with other risk factors, you may be asked to: Return on a different day to have your blood pressure checked again. Monitor your blood pressure at home for 1 week or longer. If you are diagnosed with hypertension, you may have other blood or imaging tests to help your health care provider understand your overall risk for other conditions. How is this treated? This condition is treated by making healthy lifestyle changes, such as eating healthy foods, exercising more, and reducing your alcohol intake. You may be referred for counseling on a healthy diet and physical activity. Your health care provider may prescribe medicine if lifestyle changes are not enough to get your blood pressure under control and if: Your systolic blood pressure is above 130. Your diastolic blood pressure is above 80. Your personal target blood pressure may vary depending on your medical conditions, your age, and other factors. Follow these instructions at home: Eating and drinking  Eat a diet that is high in fiber and potassium, and low in sodium, added sugar, and fat. An example of this eating plan is called the DASH diet. DASH stands for Dietary Approaches to Stop Hypertension. To eat this way: Eat   plenty of fresh fruits and vegetables. Try to fill one half of your plate at each meal with fruits and vegetables. Eat whole grains, such as whole-wheat pasta, brown rice, or whole-grain bread. Fill about one  fourth of your plate with whole grains. Eat or drink low-fat dairy products, such as skim milk or low-fat yogurt. Avoid fatty cuts of meat, processed or cured meats, and poultry with skin. Fill about one fourth of your plate with lean proteins, such as fish, chicken without skin, beans, eggs, or tofu. Avoid pre-made and processed foods. These tend to be higher in sodium, added sugar, and fat. Reduce your daily sodium intake. Many people with hypertension should eat less than 1,500 mg of sodium a day. Do not drink alcohol if: Your health care provider tells you not to drink. You are pregnant, may be pregnant, or are planning to become pregnant. If you drink alcohol: Limit how much you have to: 0-1 drink a day for women. 0-2 drinks a day for men. Know how much alcohol is in your drink. In the [place] one drink equals one 12 oz bottle of beer (355 mL), one 5 oz glass of wine (148 mL), or one 1 oz glass of hard liquor (44 mL). Lifestyle  Work with your health care provider to maintain a healthy body weight or to lose weight. Ask what an ideal weight is for you. Get at least 30 minutes of exercise that causes your heart to beat faster (aerobic exercise) most days of the week. Activities may include walking, swimming, or biking. Include exercise to strengthen your muscles (resistance exercise), such as Pilates or lifting weights, as part of your weekly exercise routine. Try to do these types of exercises for 30 minutes at least 3 days a week. Do not use any products that contain nicotine or tobacco. These products include cigarettes, chewing tobacco, and vaping devices, such as e-cigarettes. If you need help quitting, ask your health care provider. Monitor your blood pressure at home as told by your health care provider. Keep all follow-up visits. This is important. Medicines Take over-the-counter and prescription medicines only as told by your health care provider. Follow directions carefully. Blood  pressure medicines must be taken as prescribed. Do not skip doses of blood pressure medicine. Doing this puts you at risk for problems and can make the medicine less effective. Ask your health care provider about side effects or reactions to medicines that you should watch for. Contact a health care provider if you: Think you are having a reaction to a medicine you are taking. Have headaches that keep coming back (recurring). Feel dizzy. Have swelling in your ankles. Have trouble with your vision. Get help right away if you: Develop a severe headache or confusion. Have unusual weakness or numbness. Feel faint. Have severe pain in your chest or abdomen. Vomit repeatedly. Have trouble breathing. These symptoms may be an emergency. Get help right away. Call 911. Do not wait to see if the symptoms will go away. Do not drive yourself to the hospital. Summary Hypertension is when the force of blood pumping through your arteries is too strong. If this condition is not controlled, it may put you at risk for serious complications. Your personal target blood pressure may vary depending on your medical conditions, your age, and other factors. For most people, a normal blood pressure is less than 120/80. Hypertension is treated with lifestyle changes, medicines, or a combination of both. Lifestyle changes include losing weight, eating a healthy,   low-sodium diet, exercising more, and limiting alcohol. This information is not intended to replace advice given to you by your health care provider. Make sure you discuss any questions you have with your health care provider. Document Revised: 12/27/2020 Document Reviewed: 12/27/2020 Elsevier Patient Education  2023 [employer] Inc.  

## 2022-01-18 ENCOUNTER — Other Ambulatory Visit: Payer: Self-pay | Admitting: Internal Medicine

## 2022-01-18 DIAGNOSIS — E785 Hyperlipidemia, unspecified: Secondary | ICD-10-CM

## 2022-01-18 MED ORDER — ROSUVASTATIN CALCIUM 20 MG PO TABS: 20.0000 mg | ORAL_TABLET | Freq: Every day | ORAL | 1 refills | Status: DC

## 2022-03-08 ENCOUNTER — Other Ambulatory Visit: Payer: Self-pay | Admitting: Internal Medicine

## 2022-03-08 DIAGNOSIS — I10 Essential (primary) hypertension: Secondary | ICD-10-CM

## 2022-04-14 ENCOUNTER — Other Ambulatory Visit: Payer: Self-pay | Admitting: Internal Medicine

## 2022-04-14 DIAGNOSIS — I10 Essential (primary) hypertension: Secondary | ICD-10-CM

## 2022-06-04 ENCOUNTER — Other Ambulatory Visit: Payer: Self-pay | Admitting: Internal Medicine

## 2022-06-04 DIAGNOSIS — I10 Essential (primary) hypertension: Secondary | ICD-10-CM

## 2022-07-11 ENCOUNTER — Other Ambulatory Visit: Payer: Self-pay | Admitting: Internal Medicine

## 2022-07-11 DIAGNOSIS — E118 Type 2 diabetes mellitus with unspecified complications: Secondary | ICD-10-CM

## 2022-07-11 DIAGNOSIS — E785 Hyperlipidemia, unspecified: Secondary | ICD-10-CM

## 2022-07-18 ENCOUNTER — Ambulatory Visit (INDEPENDENT_AMBULATORY_CARE_PROVIDER_SITE_OTHER): Payer: Medicare HMO | Admitting: Internal Medicine

## 2022-07-18 ENCOUNTER — Encounter: Payer: Self-pay | Admitting: Internal Medicine

## 2022-07-18 VITALS — BP 144/82 | HR 65 | Temp 98.0°F | Ht 68.0 in | Wt 210.0 lb

## 2022-07-18 DIAGNOSIS — R9431 Abnormal electrocardiogram [ECG] [EKG]: Secondary | ICD-10-CM | POA: Insufficient documentation

## 2022-07-18 DIAGNOSIS — E785 Hyperlipidemia, unspecified: Secondary | ICD-10-CM

## 2022-07-18 DIAGNOSIS — R3912 Poor urinary stream: Secondary | ICD-10-CM | POA: Diagnosis not present

## 2022-07-18 DIAGNOSIS — E1169 Type 2 diabetes mellitus with other specified complication: Secondary | ICD-10-CM

## 2022-07-18 DIAGNOSIS — I10 Essential (primary) hypertension: Secondary | ICD-10-CM

## 2022-07-18 DIAGNOSIS — N401 Enlarged prostate with lower urinary tract symptoms: Secondary | ICD-10-CM | POA: Diagnosis not present

## 2022-07-18 DIAGNOSIS — Z0001 Encounter for general adult medical examination with abnormal findings: Secondary | ICD-10-CM | POA: Diagnosis not present

## 2022-07-18 DIAGNOSIS — Z7984 Long term (current) use of oral hypoglycemic drugs: Secondary | ICD-10-CM | POA: Diagnosis not present

## 2022-07-18 DIAGNOSIS — E119 Type 2 diabetes mellitus without complications: Secondary | ICD-10-CM | POA: Insufficient documentation

## 2022-07-18 DIAGNOSIS — K635 Polyp of colon: Secondary | ICD-10-CM

## 2022-07-18 LAB — MICROALBUMIN / CREATININE URINE RATIO
Creatinine,U: 98.4 mg/dL
Microalb Creat Ratio: 1 mg/g (ref 0.0–30.0)
Microalb, Ur: 1 mg/dL (ref 0.0–1.9)

## 2022-07-18 LAB — LIPID PANEL
Cholesterol: 131 mg/dL (ref 0–200)
HDL: 37.8 mg/dL — ABNORMAL LOW (ref >39.00)
LDL Cholesterol: 64 mg/dL (ref 0–99)
NonHDL: 92.75
Total CHOL/HDL Ratio: 3
Triglycerides: 146 mg/dL (ref 0.0–149.0)
VLDL: 29.2 mg/dL (ref 0.0–40.0)

## 2022-07-18 LAB — BASIC METABOLIC PANEL
BUN: 15 mg/dL (ref 6–23)
CO2: 28 mEq/L (ref 19–32)
Calcium: 9.1 mg/dL (ref 8.4–10.5)
Chloride: 98 mEq/L (ref 96–112)
Creatinine, Ser: 0.93 mg/dL (ref 0.40–1.50)
GFR: 82.24 mL/min (ref >60.00)
Glucose, Bld: 123 mg/dL — ABNORMAL HIGH (ref 70–99)
Potassium: 3.8 mEq/L (ref 3.5–5.1)
Sodium: 137 mEq/L (ref 135–145)

## 2022-07-18 LAB — HEPATIC FUNCTION PANEL
ALT: 17 U/L (ref 0–53)
AST: 18 U/L (ref 0–37)
Albumin: 4.2 g/dL (ref 3.5–5.2)
Alkaline Phosphatase: 36 U/L — ABNORMAL LOW (ref 39–117)
Bilirubin, Direct: 0.1 mg/dL (ref 0.0–0.3)
Total Bilirubin: 0.6 mg/dL (ref 0.2–1.2)
Total Protein: 7.1 g/dL (ref 6.0–8.3)

## 2022-07-18 LAB — URINALYSIS, ROUTINE W REFLEX MICROSCOPIC
Bilirubin Urine: NEGATIVE
Hgb urine dipstick: NEGATIVE
Ketones, ur: NEGATIVE
Leukocytes,Ua: NEGATIVE
Nitrite: NEGATIVE
RBC / HPF: NONE SEEN (ref 0–?)
Specific Gravity, Urine: 1.02 (ref 1.000–1.030)
Total Protein, Urine: NEGATIVE
Urine Glucose: >=1000 — AB
Urobilinogen, UA: 0.2 (ref 0.0–1.0)
pH: 6 (ref 5.0–8.0)

## 2022-07-18 LAB — CBC WITH DIFFERENTIAL/PLATELET
Basophils Absolute: 0.1 10*3/uL (ref 0.0–0.1)
Basophils Relative: 1.2 % (ref 0.0–3.0)
Eosinophils Absolute: 0.1 10*3/uL (ref 0.0–0.7)
Eosinophils Relative: 2.3 % (ref 0.0–5.0)
HCT: 43.8 % (ref 39.0–52.0)
Hemoglobin: 14.8 g/dL (ref 13.0–17.0)
Lymphocytes Relative: 27 % (ref 12.0–46.0)
Lymphs Abs: 1.7 10*3/uL (ref 0.7–4.0)
MCHC: 33.7 g/dL (ref 30.0–36.0)
MCV: 89.4 fl (ref 78.0–100.0)
Monocytes Absolute: 0.7 10*3/uL (ref 0.1–1.0)
Monocytes Relative: 11.2 % (ref 3.0–12.0)
Neutro Abs: 3.8 10*3/uL (ref 1.4–7.7)
Neutrophils Relative %: 58.3 % (ref 43.0–77.0)
Platelets: 198 10*3/uL (ref 150.0–400.0)
RBC: 4.91 Mil/uL (ref 4.22–5.81)
RDW: 14.8 % (ref 11.5–15.5)
WBC: 6.5 10*3/uL (ref 4.0–10.5)

## 2022-07-18 LAB — PSA: PSA: 0.58 ng/mL (ref 0.10–4.00)

## 2022-07-18 LAB — HEMOGLOBIN A1C: Hgb A1c MFr Bld: 6.7 % — ABNORMAL HIGH (ref 4.6–6.5)

## 2022-07-18 NOTE — Progress Notes (Signed)
Subjective:  Patient ID: Ricardo Hahn, male    DOB: April 08, 1950  Age: 72 y.o. MRN: [id number]  CC: Annual Exam, Hypertension, Hyperlipidemia, and Diabetes   HPI Ricardo Hahn presents for a CPX and f/up ---  He walks every day and has good endurance.  He denies chest pain, shortness of breath, diaphoresis, or edema.  Outpatient Medications Prior to Visit  Medication Sig Dispense Refill   acetaminophen (TYLENOL) 500 MG tablet Take 500 mg by mouth every 6 (six) hours as needed.     carvedilol (COREG) 6.25 MG tablet TAKE 1 TABLET BY MOUTH TWICE A DAY WITH FOOD 180 tablet 0   dapagliflozin propanediol (FARXIGA) 10 MG TABS tablet Take 1 tablet (10 mg total) by mouth daily before breakfast. 90 tablet 1   indapamide (LOZOL) 1.25 MG tablet TAKE 1 TABLET BY MOUTH EVERY DAY 90 tablet 1   ketoconazole (NIZORAL) 2 % cream Apply 1 Application topically 2 (two) times daily. 60 g 2   Multiple Vitamin (MULTIVITAMIN) tablet Take 1 tablet by mouth daily.     rosuvastatin (CRESTOR) 20 MG tablet Take 1 tablet (20 mg total) by mouth daily. 90 tablet 1   valsartan (DIOVAN) 160 MG tablet TAKE 1 TABLET BY MOUTH EVERY DAY 90 tablet 1   No facility-administered medications prior to visit.    ROS Review of Systems  Constitutional: Negative.  Negative for chills, diaphoresis, fatigue and fever.  HENT: Negative.    Eyes: Negative.   Respiratory:  Negative for cough, chest tightness, shortness of breath and wheezing.   Cardiovascular:  Negative for chest pain, palpitations and leg swelling.  Gastrointestinal:  Negative for abdominal pain, blood in stool, constipation, diarrhea and vomiting.  Endocrine: Negative.   Genitourinary:  Positive for difficulty urinating. Negative for dysuria, frequency and urgency.  Musculoskeletal: Negative.  Negative for arthralgias, joint swelling and myalgias.  Skin: Negative.   Neurological: Negative.  Negative for dizziness, facial asymmetry and weakness.   Hematological:  Negative for adenopathy. Does not bruise/bleed easily.  Psychiatric/Behavioral: Negative.      Objective:  BP (!) 144/82 (BP Location: Left Arm, Patient Position: Sitting, Cuff Size: Large)   Pulse 65   Temp 98 F (36.7 C) (Oral)   Ht 5\' 8"  (1.727 m)   Wt 210 lb (95.3 kg)   SpO2 96%   BMI 31.93 kg/m   BP Readings from Last 3 Encounters:  07/18/22 (!) 144/82  01/17/22 (!) 142/78  09/26/21 (!) 164/82    Wt Readings from Last 3 Encounters:  07/18/22 210 lb (95.3 kg)  01/17/22 221 lb 2 oz (100.3 kg)  09/26/21 213 lb (96.6 kg)    Physical Exam Vitals reviewed.  Constitutional:      Appearance: Normal appearance.  HENT:     Nose: Nose normal.     Mouth/Throat:     Mouth: Mucous membranes are moist.  Eyes:     General: No scleral icterus.    Conjunctiva/sclera: Conjunctivae normal.  Cardiovascular:     Rate and Rhythm: Normal rate and regular rhythm. Occasional Extrasystoles are present.    Heart sounds: Normal heart sounds, S1 normal and S2 normal. No murmur heard.    No gallop.     Comments: EKG-- SR with PSVT's, 71 bpm Septal infarct pattern is new No LVH or acute ST/T wave changes Pulmonary:     Effort: Pulmonary effort is normal.     Breath sounds: No stridor. No wheezing, rhonchi or rales.  Abdominal:  General: Abdomen is protuberant. There is no distension.     Palpations: There is no hepatomegaly, splenomegaly or mass.     Tenderness: There is no abdominal tenderness. There is no guarding.     Hernia: No hernia is present. There is no hernia in the left inguinal area or right inguinal area.  Genitourinary:    Pubic Area: No rash.      Penis: Normal and circumcised.      Testes: Normal.     Epididymis:     Right: Normal.     Left: Normal.     Prostate: Enlarged. Not tender and no nodules present.     Rectum: Normal. Guaiac result negative. No mass, tenderness, anal fissure, external hemorrhoid or internal hemorrhoid. Normal anal tone.   Musculoskeletal:     Cervical back: Neck supple.     Right lower leg: No edema.     Left lower leg: No edema.  Lymphadenopathy:     Cervical: No cervical adenopathy.     Lower Body: No right inguinal adenopathy. No left inguinal adenopathy.  Skin:    General: Skin is warm and dry.  Neurological:     General: No focal deficit present.     Mental Status: He is alert. Mental status is at baseline.  Psychiatric:        Mood and Affect: Mood normal.        Behavior: Behavior normal.     Lab Results  Component Value Date   WBC 6.5 07/18/2022   HGB 14.8 07/18/2022   HCT 43.8 07/18/2022   PLT 198.0 07/18/2022   GLUCOSE 123 (H) 07/18/2022   CHOL 131 07/18/2022   TRIG 146.0 07/18/2022   HDL 37.80 (L) 07/18/2022   LDLDIRECT 174.0 02/06/2021   LDLCALC 64 07/18/2022   ALT 17 07/18/2022   AST 18 07/18/2022   NA 137 07/18/2022   K 3.8 07/18/2022   CL 98 07/18/2022   CREATININE 0.93 07/18/2022   BUN 15 07/18/2022   CO2 28 07/18/2022   TSH 3.35 01/17/2022   PSA 0.58 07/18/2022   HGBA1C 6.7 (H) 07/18/2022   MICROALBUR 1.0 07/18/2022    MR Shoulder Left w/o contrast  Result Date: 04/13/2021 CLINICAL DATA:  Chronic left shoulder pain with popping and clicking for 6 months. Clinical concern for rotator cuff disorder. EXAM: MRI OF THE LEFT SHOULDER WITHOUT CONTRAST TECHNIQUE: Multiplanar, multisequence MR imaging of the shoulder was performed. No intravenous contrast was administered. COMPARISON:  X-ray shoulder 01/09/2021. FINDINGS: Rotator cuff: Mild supraspinatus tendinosis with tiny insertional tear along the anterior leading edge. Additional focal high-grade perforation-type tear of the supraspinatus tendon posteriorly in the region of the critical zone extending from the bursal surface and involving nearly the full tendon depth (series 100, images 11-12). Mild subscapularis tendinosis. Infraspinatus and teres minor tendons within normal limits. Muscles: Mild diffuse teres minor muscle  edema. Minimal muscle edema along the deep originating fibers of the supraspinatus muscle belly. No rotator cuff muscle atrophy. Biceps long head:  Intact and appropriately positioned. Acromioclavicular Joint: Mild-moderate degenerative changes of the AC joint. Small volume subacromial-subdeltoid bursal fluid. Glenohumeral Joint: No joint effusion. No cartilage defect. Labrum: Grossly intact although evaluation is limited in the absence of intra-articular fluid. No paralabral cyst. Bones: No acute fracture. No dislocation. No bone marrow edema. No marrow replacing bone lesion. Other: No abnormality is evident within the quadrilateral space. IMPRESSION: 1. Mild supraspinatus and subscapularis tendinosis with a focal high-grade perforation-type tear of the supraspinatus tendon  in the region of the critical zone. 2. Mild-moderate degenerative changes of the [facility] joint with mild subacromial-subdeltoid bursitis. 3. Mild diffuse teres minor muscle edema, which can be seen in the setting of muscle strain or acute denervation. No abnormality is evident within the quadrilateral space. Electronically Signed   By: Duanne Guess D.O.   On: 04/13/2021 16:20    Assessment & Plan:   Benign prostatic hyperplasia with weak urinary stream -     PSA; Future -     Urinalysis, Routine w reflex microscopic; Future  Hyperlipidemia LDL goal <100- LDL goal achieved. Doing well on the statin  -     Lipid panel; Future -     Hepatic function panel; Future -     CT CARDIAC SCORING ([facility] LOCATIONS ONLY); Future  Primary hypertension- His blood pressure is not well-controlled.  Will increase the ARB dose. -     Urinalysis, Routine w reflex microscopic; Future -     CBC with Differential/Platelet; Future -     Basic metabolic panel; Future -     Hepatic function panel; Future -     EKG 12-Lead  Type 2 diabetes mellitus with other specified complication, without long-term current use of insulin (HCC)- His blood sugar is  adequately well-controlled. -     Microalbumin / creatinine urine ratio; Future -     Hemoglobin A1c; Future -     Basic metabolic panel; Future -     Hepatic function panel; Future -     HM Diabetes Foot Exam -     CT CARDIAC SCORING ([facility] LOCATIONS ONLY); Future  Encounter for general adult medical examination with abnormal findings- Exam completed, labs reviewed, vaccines reviewed and updated, cancer screenings addressed, patient education was given.  Abnormal electrocardiogram- Will evaluate for CAD. -     CT CARDIAC SCORING ([facility] LOCATIONS ONLY); Future     Follow-up: Return in about 6 months (around 01/18/2023).  Sanda Linger, MD

## 2022-07-18 NOTE — Patient Instructions (Signed)
Health Maintenance, Male Adopting a healthy lifestyle and getting preventive care are important in promoting health and wellness. Ask your health care provider about: The right schedule for you to have regular tests and exams. Things you can do on your own to prevent diseases and keep yourself healthy. What should I know about diet, weight, and exercise? Eat a healthy diet  Eat a diet that includes plenty of vegetables, fruits, low-fat dairy products, and lean protein. Do not eat a lot of foods that are high in solid fats, added sugars, or sodium. Maintain a healthy weight Body mass index (BMI) is a measurement that can be used to identify possible weight problems. It estimates body fat based on height and weight. Your health care provider can help determine your BMI and help you achieve or maintain a healthy weight. Get regular exercise Get regular exercise. This is one of the most important things you can do for your health. Most adults should: Exercise for at least 150 minutes each week. The exercise should increase your heart rate and make you sweat (moderate-intensity exercise). Do strengthening exercises at least twice a week. This is in addition to the moderate-intensity exercise. Spend less time sitting. Even light physical activity can be beneficial. Watch cholesterol and blood lipids Have your blood tested for lipids and cholesterol at 72 years of age, then have this test every 5 years. You may need to have your cholesterol levels checked more often if: Your lipid or cholesterol levels are high. You are older than 72 years of age. You are at high risk for heart disease. What should I know about cancer screening? Many types of cancers can be detected early and may often be prevented. Depending on your health history and family history, you may need to have cancer screening at various ages. This may include screening for: Colorectal cancer. Prostate cancer. Skin cancer. Lung  cancer. What should I know about heart disease, diabetes, and high blood pressure? Blood pressure and heart disease High blood pressure causes heart disease and increases the risk of stroke. This is more likely to develop in people who have high blood pressure readings or are overweight. Talk with your health care provider about your target blood pressure readings. Have your blood pressure checked: Every 3-5 years if you are 18-39 years of age. Every year if you are 40 years old or older. If you are between the ages of 65 and 75 and are a current or former smoker, ask your health care provider if you should have a one-time screening for abdominal aortic aneurysm (AAA). Diabetes Have regular diabetes screenings. This checks your fasting blood sugar level. Have the screening done: Once every three years after age 45 if you are at a normal weight and have a low risk for diabetes. More often and at a younger age if you are overweight or have a high risk for diabetes. What should I know about preventing infection? Hepatitis B If you have a higher risk for hepatitis B, you should be screened for this virus. Talk with your health care provider to find out if you are at risk for hepatitis B infection. Hepatitis C Blood testing is recommended for: Everyone born from 1945 through 1965. Anyone with known risk factors for hepatitis C. Sexually transmitted infections (STIs) You should be screened each year for STIs, including gonorrhea and chlamydia, if: You are sexually active and are younger than 72 years of age. You are older than 72 years of age and your   health care provider tells you that you are at risk for this type of infection. Your sexual activity has changed since you were last screened, and you are at increased risk for chlamydia or gonorrhea. Ask your health care provider if you are at risk. Ask your health care provider about whether you are at high risk for HIV. Your health care provider  may recommend a prescription medicine to help prevent HIV infection. If you choose to take medicine to prevent HIV, you should first get tested for HIV. You should then be tested every 3 months for as long as you are taking the medicine. Follow these instructions at home: Alcohol use Do not drink alcohol if your health care provider tells you not to drink. If you drink alcohol: Limit how much you have to 0-2 drinks a day. Know how much alcohol is in your drink. In the [place] one drink equals one 12 oz bottle of beer (355 mL), one 5 oz glass of wine (148 mL), or one 1 oz glass of hard liquor (44 mL). Lifestyle Do not use any products that contain nicotine or tobacco. These products include cigarettes, chewing tobacco, and vaping devices, such as e-cigarettes. If you need help quitting, ask your health care provider. Do not use street drugs. Do not share needles. Ask your health care provider for help if you need support or information about quitting drugs. General instructions Schedule regular health, dental, and eye exams. Stay current with your vaccines. Tell your health care provider if: You often feel depressed. You have ever been abused or do not feel safe at home. Summary Adopting a healthy lifestyle and getting preventive care are important in promoting health and wellness. Follow your health care provider's instructions about healthy diet, exercising, and getting tested or screened for diseases. Follow your health care provider's instructions on monitoring your cholesterol and blood pressure. This information is not intended to replace advice given to you by your health care provider. Make sure you discuss any questions you have with your health care provider. Document Revised: 07/11/2020 Document Reviewed: 07/11/2020 [employer]vier Patient Education  2023 [employer]  

## 2022-07-20 ENCOUNTER — Encounter: Payer: Self-pay | Admitting: Internal Medicine

## 2022-07-20 DIAGNOSIS — E118 Type 2 diabetes mellitus with unspecified complications: Secondary | ICD-10-CM

## 2022-07-20 MED ORDER — VALSARTAN 320 MG PO TABS
320.0000 mg | ORAL_TABLET | Freq: Every day | ORAL | 1 refills | Status: DC
Start: 2022-07-20 — End: 2023-01-04

## 2022-07-23 MED ORDER — DAPAGLIFLOZIN PROPANEDIOL 10 MG PO TABS
10.0000 mg | ORAL_TABLET | Freq: Every day | ORAL | 1 refills | Status: DC
Start: 2022-07-23 — End: 2023-01-27

## 2022-07-28 ENCOUNTER — Other Ambulatory Visit: Payer: Self-pay | Admitting: Internal Medicine

## 2022-07-28 DIAGNOSIS — E785 Hyperlipidemia, unspecified: Secondary | ICD-10-CM

## 2022-08-23 ENCOUNTER — Other Ambulatory Visit: Payer: Medicare HMO

## 2022-08-27 ENCOUNTER — Ambulatory Visit
Admission: RE | Admit: 2022-08-27 | Discharge: 2022-08-27 | Disposition: A | Discharge status: Home / Self Care | Payer: No Typology Code available for payment source | Source: Ambulatory Visit | Attending: Internal Medicine | Admitting: Internal Medicine

## 2022-08-27 DIAGNOSIS — R9431 Abnormal electrocardiogram [ECG] [EKG]: Secondary | ICD-10-CM

## 2022-08-27 DIAGNOSIS — E1169 Type 2 diabetes mellitus with other specified complication: Secondary | ICD-10-CM

## 2022-08-27 DIAGNOSIS — E785 Hyperlipidemia, unspecified: Secondary | ICD-10-CM

## 2022-08-29 ENCOUNTER — Other Ambulatory Visit: Payer: Self-pay | Admitting: Internal Medicine

## 2022-08-29 DIAGNOSIS — I10 Essential (primary) hypertension: Secondary | ICD-10-CM

## 2022-10-19 ENCOUNTER — Other Ambulatory Visit: Payer: Self-pay | Admitting: Internal Medicine

## 2022-10-19 DIAGNOSIS — I10 Essential (primary) hypertension: Secondary | ICD-10-CM

## 2022-11-12 IMAGING — MR MR SHOULDER*L* W/O CM
4 of 5 series · 20 of 40 positions shown · non-contrast
Comparison: X-ray shoulder 01/09/2021.

CLINICAL DATA: Chronic left shoulder pain with popping and clicking
for 6 months. Clinical concern for rotator cuff disorder.

EXAM:
MRI OF THE LEFT SHOULDER WITHOUT CONTRAST
TECHNIQUE: Multiplanar, multisequence MR imaging of the shoulder was performed.
No intravenous contrast was administered.

[Series 6: T2 fat-sat · oblique · left · 3.0mm · 0.47mm/px · 7 of 31 slices shown (1 of 3)]
[im 1/31]
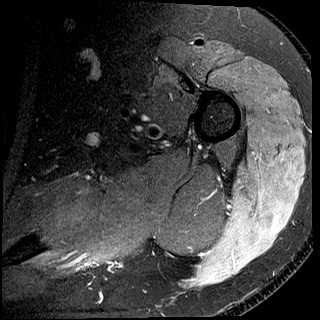
[im 4/31]
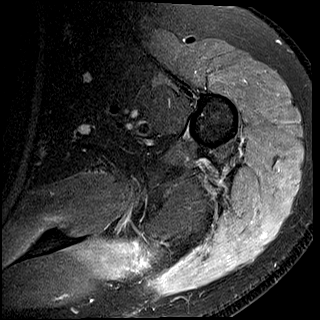
[im 11/31]
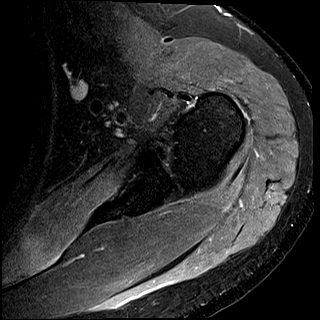
[im 14/31]
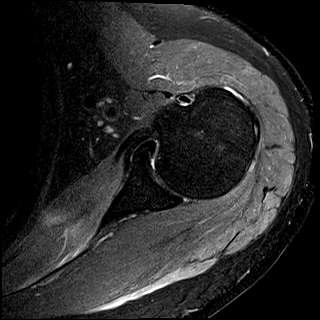
[im 17/31]
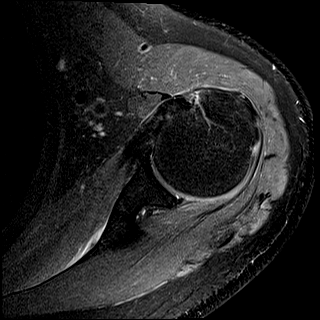
[im 21/31]
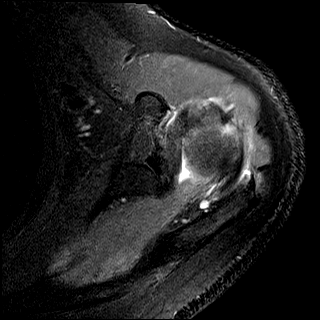
[im 27/31]
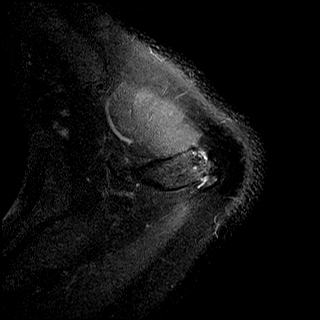

[Series 100: T2 fat-sat · oblique · left · 4.0mm · 0.22mm/px · 3 of 21 slices shown (2 of 3)]
[im 4/21]
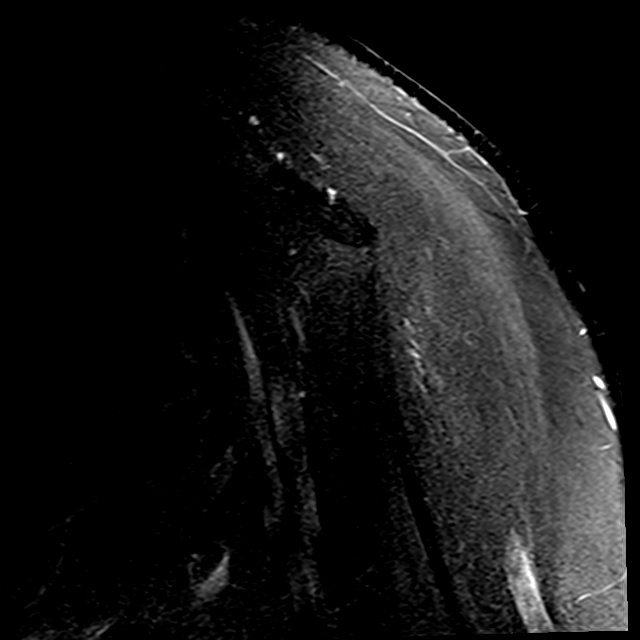
[im 11/21]
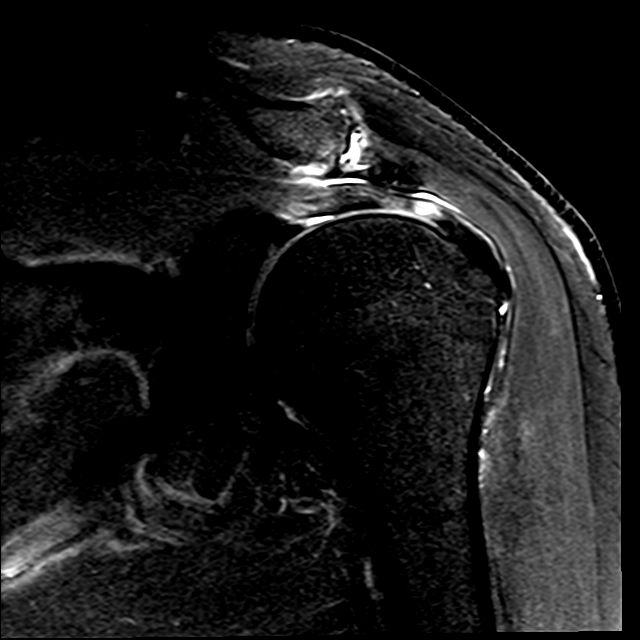
[im 17/21]
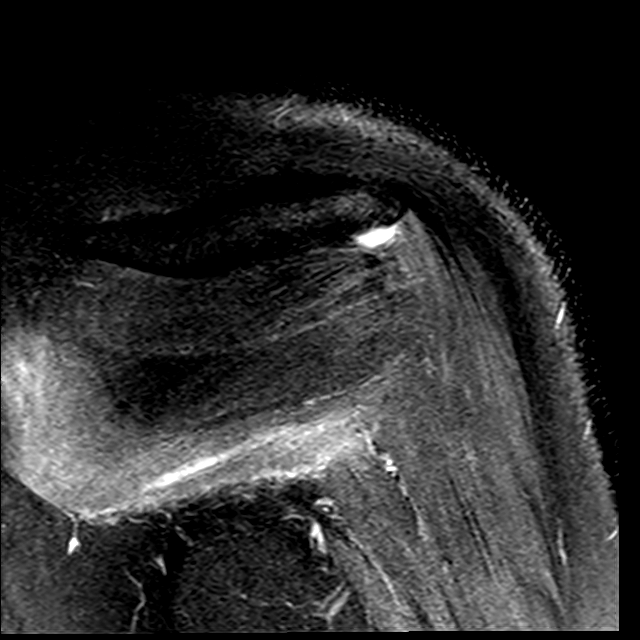

[Series 101: PD · oblique · left · 4.0mm · 0.22mm/px · 7 of 21 slices shown]
[im 1/21]
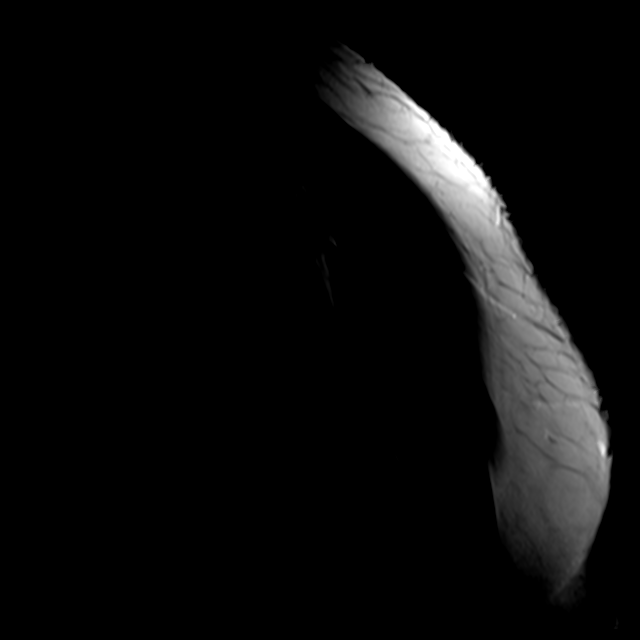
[im 4/21]
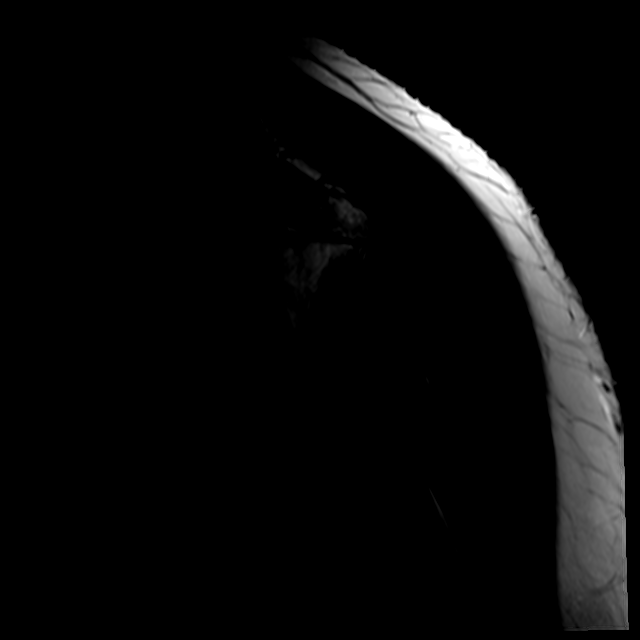
[im 7/21]
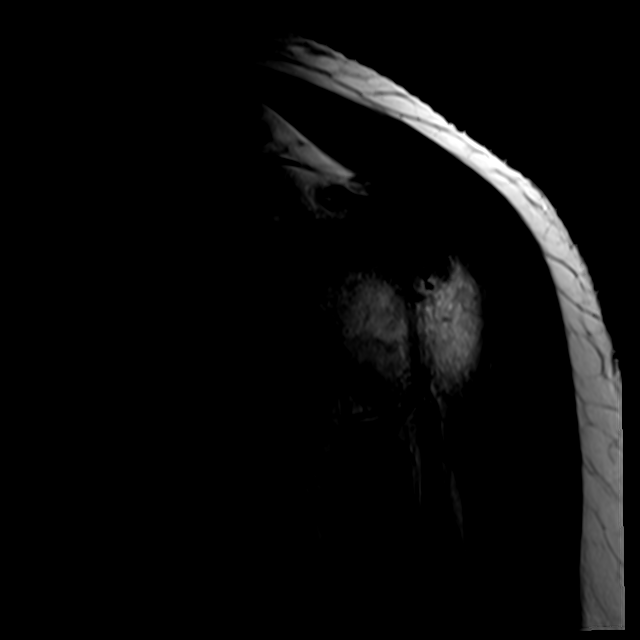
[im 11/21]
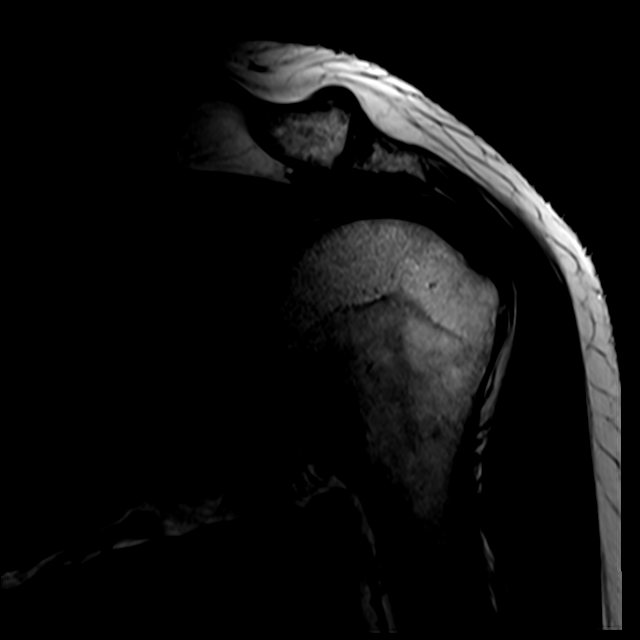
[im 14/21]
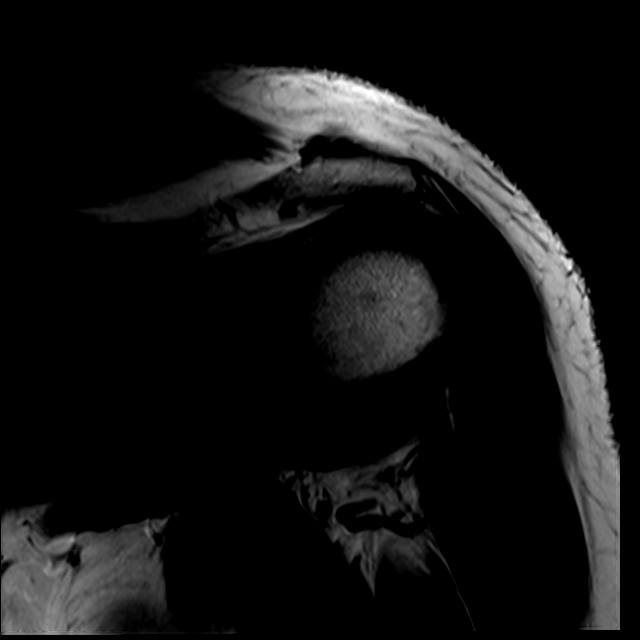
[im 17/21]
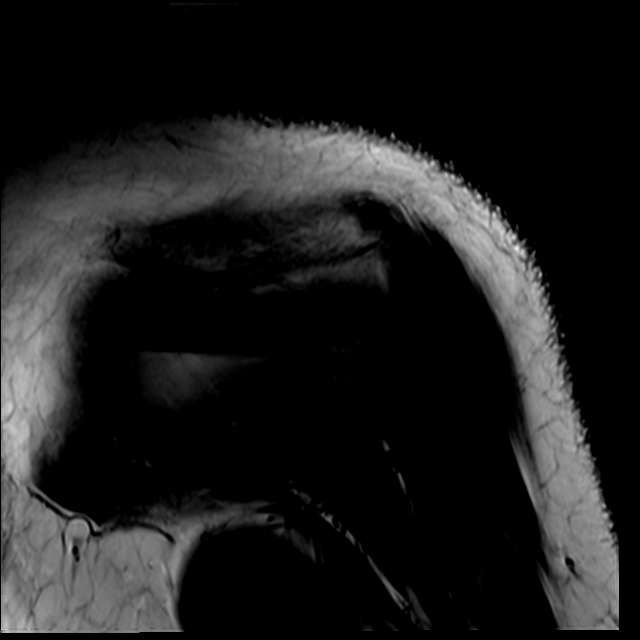
[im 21/21]
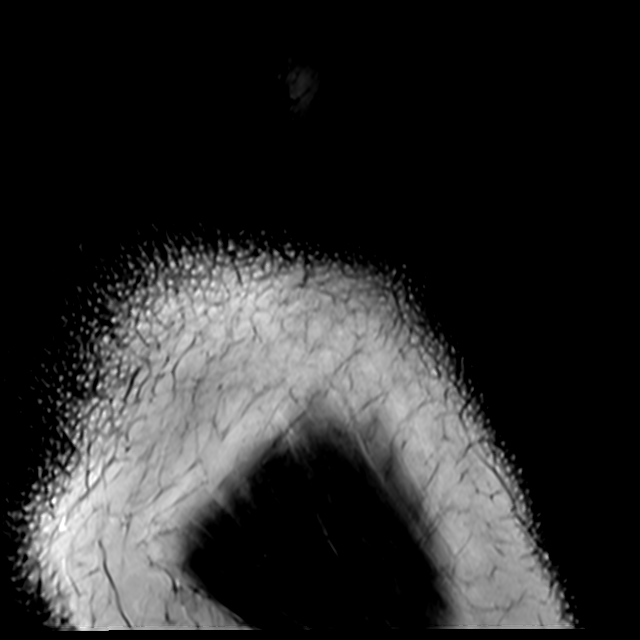

[Series 102: T2 fat-sat · oblique · left · 4.0mm · 0.44mm/px · 3 of 23 slices shown (3 of 3)]
[im 4/23]
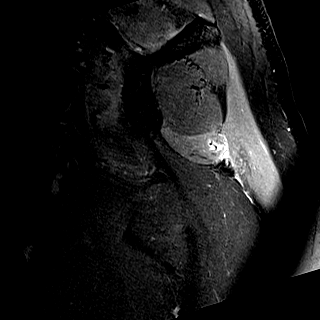
[im 13/23]
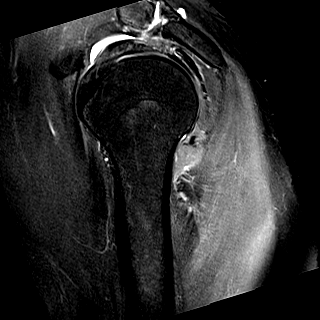
[im 19/23]
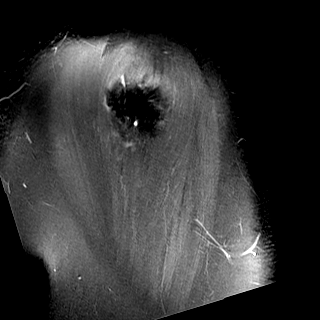

[20 of 40 positions shown; findings below may reference images not displayed]

FINDINGS: Rotator cuff: Mild supraspinatus tendinosis with tiny insertional
tear along the anterior leading edge. Additional focal high-grade
perforation-type tear of the supraspinatus tendon posteriorly in the
region of the critical zone extending from the bursal surface and
involving nearly the full tendon depth (series 100, images 11-12).
Mild subscapularis tendinosis. Infraspinatus and teres minor tendons
within normal limits.

Muscles: Mild diffuse teres minor muscle edema. Minimal muscle edema
along the deep originating fibers of the supraspinatus muscle belly.
No rotator cuff muscle atrophy.

Biceps long head:  Intact and appropriately positioned.

Acromioclavicular Joint: Mild-moderate degenerative changes of the
AC joint. Small volume subacromial-subdeltoid bursal fluid.

Glenohumeral Joint: No joint effusion. No cartilage defect.

Labrum: Grossly intact although evaluation is limited in the absence
of intra-articular fluid. No paralabral cyst.

Bones: No acute fracture. No dislocation. No bone marrow edema. No
marrow replacing bone lesion.

Other: No abnormality is evident within the quadrilateral space.
IMPRESSION: 1. Mild supraspinatus and subscapularis tendinosis with a focal
high-grade perforation-type tear of the supraspinatus tendon in the
region of the critical zone.
2. Mild-moderate degenerative changes of the AC joint with mild
subacromial-subdeltoid bursitis.
3. Mild diffuse teres minor muscle edema, which can be seen in the
setting of muscle strain or acute denervation. No abnormality is
evident within the quadrilateral space.

## 2022-11-22 DIAGNOSIS — H5213 Myopia, bilateral: Secondary | ICD-10-CM | POA: Diagnosis not present

## 2022-11-22 LAB — HM DIABETES EYE EXAM

## 2022-11-24 ENCOUNTER — Other Ambulatory Visit: Payer: Self-pay | Admitting: Internal Medicine

## 2022-11-24 DIAGNOSIS — I10 Essential (primary) hypertension: Secondary | ICD-10-CM

## 2023-01-04 ENCOUNTER — Other Ambulatory Visit: Payer: Self-pay | Admitting: Internal Medicine

## 2023-01-04 DIAGNOSIS — I10 Essential (primary) hypertension: Secondary | ICD-10-CM

## 2023-01-04 DIAGNOSIS — E1169 Type 2 diabetes mellitus with other specified complication: Secondary | ICD-10-CM

## 2023-01-06 ENCOUNTER — Encounter: Payer: Self-pay | Admitting: Internal Medicine

## 2023-01-07 ENCOUNTER — Other Ambulatory Visit: Payer: Self-pay | Admitting: Internal Medicine

## 2023-01-07 DIAGNOSIS — I10 Essential (primary) hypertension: Secondary | ICD-10-CM

## 2023-01-07 MED ORDER — CARVEDILOL 6.25 MG PO TABS
6.2500 mg | ORAL_TABLET | Freq: Two times a day (BID) | ORAL | 0 refills | Status: DC
Start: 2023-01-07 — End: 2023-04-30

## 2023-01-08 ENCOUNTER — Ambulatory Visit: Payer: Medicare HMO | Admitting: Internal Medicine

## 2023-01-11 ENCOUNTER — Other Ambulatory Visit: Payer: Self-pay | Admitting: Internal Medicine

## 2023-01-11 DIAGNOSIS — E785 Hyperlipidemia, unspecified: Secondary | ICD-10-CM

## 2023-01-11 DIAGNOSIS — E118 Type 2 diabetes mellitus with unspecified complications: Secondary | ICD-10-CM

## 2023-01-14 NOTE — Progress Notes (Signed)
This encounter was created in error - please disregard.  I called patient twice waiting 7-10 mins in between calls, with no answer.  I could not leave a message due to patient's voice mail not being set up.

## 2023-01-16 ENCOUNTER — Ambulatory Visit: Payer: Medicare HMO | Admitting: Internal Medicine

## 2023-01-16 ENCOUNTER — Other Ambulatory Visit: Payer: Self-pay | Admitting: Internal Medicine

## 2023-01-16 ENCOUNTER — Encounter: Payer: Self-pay | Admitting: Internal Medicine

## 2023-01-16 VITALS — BP 148/80 | HR 62 | Temp 98.1°F | Resp 16 | Ht 68.0 in | Wt 208.8 lb

## 2023-01-16 DIAGNOSIS — I491 Atrial premature depolarization: Secondary | ICD-10-CM | POA: Diagnosis not present

## 2023-01-16 DIAGNOSIS — K635 Polyp of colon: Secondary | ICD-10-CM

## 2023-01-16 DIAGNOSIS — I10 Essential (primary) hypertension: Secondary | ICD-10-CM | POA: Diagnosis not present

## 2023-01-16 DIAGNOSIS — E1169 Type 2 diabetes mellitus with other specified complication: Secondary | ICD-10-CM

## 2023-01-16 LAB — TSH: TSH: 2.44 u[IU]/mL (ref 0.35–5.50)

## 2023-01-16 LAB — BASIC METABOLIC PANEL
BUN: 14 mg/dL (ref 6–23)
CO2: 29 meq/L (ref 19–32)
Calcium: 9.1 mg/dL (ref 8.4–10.5)
Chloride: 101 meq/L (ref 96–112)
Creatinine, Ser: 0.87 mg/dL (ref 0.40–1.50)
GFR: 86.11 mL/min (ref >60.00)
Glucose, Bld: 122 mg/dL — ABNORMAL HIGH (ref 70–99)
Potassium: 3.8 meq/L (ref 3.5–5.1)
Sodium: 139 meq/L (ref 135–145)

## 2023-01-16 LAB — HEMOGLOBIN A1C: Hgb A1c MFr Bld: 6.5 % (ref 4.6–6.5)

## 2023-01-16 NOTE — Patient Instructions (Signed)

## 2023-01-16 NOTE — Progress Notes (Unsigned)
Subjective:  Patient ID: Ricardo Hahn, male    DOB: 01-14-1951  Age: 72 y.o. MRN: [id number]  CC: Hypertension and Diabetes   HPI Ricardo Hahn presents for f/up ---  Discussed the use of AI scribe software for clinical note transcription with the patient, who gave verbal consent to proceed.  History of Present Illness   The patient, with a history of hypertension, presents with concerns about his medication regimen. He reports no symptoms of headache, blurred vision, chest pain, shortness of breath, or dizziness. He has been compliant with his carvedilol but expresses financial concerns about continuing Marcelline Deist due to reaching the "donut hole" in his insurance coverage. He denies any side effects from his current medications, including muscle aches, dizziness, lightheadedness, or edema.       Outpatient Medications Prior to Visit  Medication Sig Dispense Refill   acetaminophen (TYLENOL) 500 MG tablet Take 500 mg by mouth every 6 (six) hours as needed.     carvedilol (COREG) 6.25 MG tablet Take 1 tablet (6.25 mg total) by mouth 2 (two) times daily with a meal. 60 tablet 0   dapagliflozin propanediol (FARXIGA) 10 MG TABS tablet Take 1 tablet (10 mg total) by mouth daily before breakfast. 90 tablet 1   indapamide (LOZOL) 1.25 MG tablet TAKE 1 TABLET BY MOUTH EVERY DAY 90 tablet 0   ketoconazole (NIZORAL) 2 % cream Apply 1 Application topically 2 (two) times daily. 60 g 2   Multiple Vitamin (MULTIVITAMIN) tablet Take 1 tablet by mouth daily.     rosuvastatin (CRESTOR) 20 MG tablet TAKE 1 TABLET BY MOUTH EVERY DAY 90 tablet 1   valsartan (DIOVAN) 320 MG tablet TAKE 1 TABLET BY MOUTH EVERY DAY 90 tablet 0   No facility-administered medications prior to visit.    ROS Review of Systems  Objective:  BP (!) 148/80 (BP Location: Left Arm, Patient Position: Sitting, Cuff Size: Normal)   Pulse 62   Temp 98.1 F (36.7 C) (Oral)   Resp 16   Ht 5\' 8"  (1.727 m)   Wt 208 lb 12.8 oz (94.7  kg)   SpO2 97%   BMI 31.75 kg/m   BP Readings from Last 3 Encounters:  01/16/23 (!) 148/80  07/18/22 (!) 144/82  01/17/22 (!) 142/78    Wt Readings from Last 3 Encounters:  01/16/23 208 lb 12.8 oz (94.7 kg)  07/18/22 210 lb (95.3 kg)  01/17/22 221 lb 2 oz (100.3 kg)    Physical Exam Cardiovascular:     Rate and Rhythm: Normal rate. Frequent Extrasystoles are present.    Lab Results  Component Value Date   WBC 6.5 07/18/2022   HGB 14.8 07/18/2022   HCT 43.8 07/18/2022   PLT 198.0 07/18/2022   GLUCOSE 123 (H) 07/18/2022   CHOL 131 07/18/2022   TRIG 146.0 07/18/2022   HDL 37.80 (L) 07/18/2022   LDLDIRECT 174.0 02/06/2021   LDLCALC 64 07/18/2022   ALT 17 07/18/2022   AST 18 07/18/2022   NA 137 07/18/2022   K 3.8 07/18/2022   CL 98 07/18/2022   CREATININE 0.93 07/18/2022   BUN 15 07/18/2022   CO2 28 07/18/2022   TSH 3.35 01/17/2022   PSA 0.58 07/18/2022   HGBA1C 6.7 (H) 07/18/2022   MICROALBUR 1.0 07/18/2022    CT CARDIAC SCORING ([facility] LOCATIONS ONLY)  Result Date: 08/27/2022 CLINICAL DATA:  72 year old Caucasian male with history of hyperlipidemia and diabetes. * Tracking Code: FCC * EXAM: CT CARDIAC CORONARY ARTERY CALCIUM SCORE TECHNIQUE:  Non-contrast imaging through the heart was performed using prospective ECG gating. Image post processing was performed on an independent workstation, allowing for quantitative analysis of the heart and coronary arteries. Note that this exam targets the heart and the chest was not imaged in its entirety. COMPARISON:  None available. FINDINGS: CORONARY CALCIUM SCORES: Left Main: 0 LAD: 20 LCx: 95 RCA: 0 Total Agatston Score: 114 MESA database percentile: 42nd AORTA MEASUREMENTS: Ascending Aorta: 3.8 cm Descending Aorta:2.8 cm OTHER FINDINGS: Atherosclerotic calcifications in the thoracic aorta. Calcifications of the aortic valve. Within the visualized portions of the thorax there are no suspicious appearing pulmonary nodules or masses,  there is no acute consolidative airspace disease, no pleural effusions, no pneumothorax and no lymphadenopathy. Visualized portions of the upper abdomen are unremarkable. There are no aggressive appearing lytic or blastic lesions noted in the visualized portions of the skeleton. IMPRESSION: 1. Patient's total coronary artery calcium score is 114 which is 42nd percentile for patient's of matched age, gender and race/ethnicity. Please note that although the presence of coronary artery calcium documents the presence of coronary artery disease, the severity of this disease and any potential stenosis cannot be assessed on this noncontrast CT examination. Assessment for potential risk factor modification, dietary therapy or pharmacologic therapy may be warranted, if clinically indicated. 2.  Aortic Atherosclerosis (ICD10-I70.0). 3. There are mild calcifications of the aortic valve. Echocardiographic correlation for evaluation of potential valvular dysfunction may be warranted if clinically indicated. Electronically Signed   By: Trudie Reed M.D.   On: 08/27/2022 10:56    Assessment & Plan:  Primary hypertension -     Basic metabolic panel; Future  Type 2 diabetes mellitus with other specified complication, without long-term current use of insulin (HCC) -     Basic metabolic panel; Future -     Hemoglobin A1c; Future -     AMB Referral VBCI Care Management  Premature supraventricular beats -     TSH; Future  Polyp of colon, unspecified part of colon, unspecified type -     Ambulatory referral to Gastroenterology     Follow-up: Return in about 6 months (around 07/16/2023).  Sanda Linger, MD

## 2023-01-20 ENCOUNTER — Encounter: Payer: Self-pay | Admitting: Internal Medicine

## 2023-01-25 ENCOUNTER — Ambulatory Visit (INDEPENDENT_AMBULATORY_CARE_PROVIDER_SITE_OTHER): Payer: Medicare HMO

## 2023-01-25 DIAGNOSIS — Z0000 Encounter for general adult medical examination without abnormal findings: Secondary | ICD-10-CM | POA: Diagnosis not present

## 2023-01-25 NOTE — Progress Notes (Signed)
Subjective:   Ricardo Hahn is a 72 y.o. male who presents for Medicare Annual/Subsequent preventive examination.  Visit Complete: Virtual I connected with  Ricardo Hahn on 01/25/23 by a audio enabled telemedicine application and verified that I am speaking with the correct person using two identifiers.  Patient Location: Home  Provider Location: Office/Clinic  I discussed the limitations of evaluation and management by telemedicine. The patient expressed understanding and agreed to proceed.  Vital Signs: Because this visit was a virtual/telehealth visit, some criteria may be missing or patient reported. Any vitals not documented were not able to be obtained and vitals that have been documented are patient reported.  Patient Medicare AWV questionnaire was completed by the patient on 01/23/2023; I have confirmed that all information answered by patient is correct and no changes since this date.  Cardiac Risk Factors include: advanced age (>55men, >38 women);diabetes mellitus;dyslipidemia;hypertension;male gender     Objective:    Today's Vitals   There is no height or weight on file to calculate BMI.     01/25/2023    8:50 AM 01/15/2022    2:22 PM 01/04/2020    9:34 PM 03/30/2016    1:37 PM  Advanced Directives  Does Patient Have a Medical Advance Directive? No No No No  Would patient like information on creating a medical advance directive?  No - Patient declined      Current Medications (verified) Outpatient Encounter Medications as of 01/25/2023  Medication Sig   acetaminophen (TYLENOL) 500 MG tablet Take 500 mg by mouth every 6 (six) hours as needed.   carvedilol (COREG) 6.25 MG tablet Take 1 tablet (6.25 mg total) by mouth 2 (two) times daily with a meal.   dapagliflozin propanediol (FARXIGA) 10 MG TABS tablet Take 1 tablet (10 mg total) by mouth daily before breakfast.   indapamide (LOZOL) 1.25 MG tablet TAKE 1 TABLET BY MOUTH EVERY DAY   ketoconazole (NIZORAL) 2  % cream Apply 1 Application topically 2 (two) times daily.   Multiple Vitamin (MULTIVITAMIN) tablet Take 1 tablet by mouth daily.   rosuvastatin (CRESTOR) 20 MG tablet TAKE 1 TABLET BY MOUTH EVERY DAY   valsartan (DIOVAN) 320 MG tablet TAKE 1 TABLET BY MOUTH EVERY DAY   No facility-administered encounter medications on file as of 01/25/2023.    Allergies (verified) Shellfish allergy   History: Past Medical History:  Diagnosis Date   Allergy    seasonal   Arthritis    Past Surgical History:  Procedure Laterality Date   ingunial hernia repair Left 1975   Family History  Problem Relation Age of Onset   Hypertension Mother    Stroke Mother    Kidney failure Mother    Colon cancer Father 9   Coronary artery disease Father    Colon cancer Brother 20   Colon cancer Paternal Grandfather 75   Social History   Socioeconomic History   Marital status: Married    Spouse name: Ricardo Hahn   Number of children: Not on file   Years of education: Not on file   Highest education level: Some college, no degree  Occupational History   Not on file  Tobacco Use   Smoking status: Former    Current packs/day: 0.00    Types: Cigarettes    Quit date: 12/15/1973    Years since quitting: 49.1    Passive exposure: Past   Smokeless tobacco: Current    Types: Snuff  Vaping Use   Vaping status: Never Used  Substance  and Sexual Activity   Alcohol use: No   Drug use: No   Sexual activity: Yes    Partners: Female  Other Topics Concern   Not on file  Social History Narrative   Not on file   Social Determinants of Health   Financial Resource Strain: Low Risk  (01/23/2023)   Overall Financial Resource Strain (CARDIA)    Difficulty of Paying Living Expenses: Not very hard  Recent Concern: Financial Resource Strain - Medium Risk (01/15/2023)   Overall Financial Resource Strain (CARDIA)    Difficulty of Paying Living Expenses: Somewhat hard  Food Insecurity: No Food Insecurity (01/23/2023)    Hunger Vital Sign    Worried About Running Out of Food in the Last Year: Never true    Ran Out of Food in the Last Year: Never true  Transportation Needs: No Transportation Needs (01/23/2023)   PRAPARE - Administrator, Civil Service (Medical): No    Lack of Transportation (Non-Medical): No  Physical Activity: Insufficiently Active (01/23/2023)   Exercise Vital Sign    Days of Exercise per Week: 2 days    Minutes of Exercise per Session: 20 min  Stress: No Stress Concern Present (01/23/2023)   [employer] of Occupational Health - Occupational Stress Questionnaire    Feeling of Stress : Only a little  Social Connections: Socially Integrated (01/23/2023)   Social Connection and Isolation Panel [NHANES]    Frequency of Communication with Friends and Family: Three times a week    Frequency of Social Gatherings with Friends and Family: Twice a week    Attends Religious Services: More than 4 times per year    Active Member of [employer] or Organizations: No    Attends Engineer, structural: 1 to 4 times per year    Marital Status: Married    Tobacco Counseling Ready to quit: Not Answered Counseling given: Not Answered   Clinical Intake:  Pre-visit preparation completed: Yes  Pain : No/denies pain     Nutritional Risks: None Diabetes: Yes CBG done?: No Did pt. bring in CBG monitor from home?: No  How often do you need to have someone help you when you read instructions, pamphlets, or other written materials from your doctor or pharmacy?: 1 - Never  Interpreter Needed?: No  Information entered by :: NAllen LPN   Activities of Daily Living    01/23/2023    3:34 PM  In your present state of health, do you have any difficulty performing the following activities:  Hearing? 0  Vision? 0  Difficulty concentrating or making decisions? 0  Walking or climbing stairs? 0  Dressing or bathing? 0  Doing errands, shopping? 0  Preparing Food and eating ? N   Using the Toilet? N  In the past six months, have you accidently leaked urine? N  Do you have problems with loss of bowel control? N  Managing your Medications? N  Managing your Finances? N  Housekeeping or managing your Housekeeping? N    Patient Care Team: Etta Grandchild, MD as PCP - General (Internal Medicine) Fredrich Birks, OD as Referring Physician (Optometry)  Indicate any recent Medical Services you may have received from other than [facility] providers in the past year (date may be approximate).     Assessment:   This is a routine wellness examination for [place].  Hearing/Vision screen Hearing Screening - Comments:: Denies hearing issues Vision Screening - Comments:: Regular eye exams, Dr. Lorin Picket   Goals Addressed  This Visit's Progress    Patient Stated       01/25/2023, get sugar down and keep BP down       Depression Screen    01/25/2023    8:51 AM 01/16/2023    8:48 AM 01/15/2022    2:18 PM 09/26/2021   10:12 AM 02/06/2021   10:11 AM  PHQ 2/9 Scores  PHQ - 2 Score 0 0 0 0 0  PHQ- 9 Score 1   2     Fall Risk    01/23/2023    3:34 PM 01/16/2023    8:48 AM 01/15/2022    2:23 PM 09/26/2021   10:12 AM  Fall Risk   Falls in the past year? 0 0 0 0  Number falls in past yr: 0 0 0   Injury with Fall? 0 0 0 0  Risk for fall due to : Medication side effect No Fall Risks  No Fall Risks  Follow up Falls prevention discussed;Falls evaluation completed Falls evaluation completed Falls prevention discussed Falls evaluation completed    MEDICARE RISK AT HOME: Medicare Risk at Home Any stairs in or around the home?: Yes If so, are there any without handrails?: No Home free of loose throw rugs in walkways, pet beds, electrical cords, etc?: Yes Adequate lighting in your home to reduce risk of falls?: Yes Life alert?: No Use of a cane, walker or w/c?: No Grab bars in the bathroom?: Yes Shower chair or bench in shower?: Yes Elevated toilet seat or a  handicapped toilet?: No  TIMED UP AND GO:  Was the test performed?  No    Cognitive Function:        01/25/2023    8:53 AM 01/15/2022    2:23 PM  6CIT Screen  What Year? 0 points 0 points  What month? 0 points 0 points  What time? 0 points 0 points  Count back from 20 0 points 0 points  Months in reverse 0 points 0 points  Repeat phrase 0 points 0 points  Total Score 0 points 0 points    Immunizations Immunization History  Administered Date(s) Administered   Fluad Quad(high Dose 65+) 02/06/2021, 12/11/2021   Influenza Inj Mdck Quad With Preservative 12/11/2021   PNEUMOCOCCAL CONJUGATE-20 02/06/2021   Tdap 12/11/2021   Zoster Recombinant(Shingrix) 12/11/2021, 03/16/2022    TDAP status: Up to date  Flu Vaccine status: Up to date  Pneumococcal vaccine status: Up to date  Covid-19 vaccine status: Declined, Education has been provided regarding the importance of this vaccine but patient still declined. Advised may receive this vaccine at local pharmacy or Health Dept.or vaccine clinic. Aware to provide a copy of the vaccination record if obtained from local pharmacy or Health Dept. Verbalized acceptance and understanding.  Qualifies for Shingles Vaccine? Yes   Zostavax completed Yes   Shingrix Completed?: Yes  Screening Tests Health Maintenance  Topic Date Due   Colonoscopy  03/31/2019   COVID-19 Vaccine (1 - 2023-24 season) 02/10/2023 (Originally 11/04/2022)   HEMOGLOBIN A1C  07/16/2023   Diabetic kidney evaluation - Urine ACR  07/18/2023   FOOT EXAM  07/18/2023   OPHTHALMOLOGY EXAM  11/22/2023   Diabetic kidney evaluation - eGFR measurement  01/16/2024   Medicare Annual Wellness (AWV)  01/25/2024   DTaP/Tdap/Td (2 - Td or Tdap) 12/12/2031   Pneumonia Vaccine 66+ Years old  Completed   INFLUENZA VACCINE  Completed   Hepatitis C Screening  Completed   Zoster Vaccines- Shingrix  Completed  HPV VACCINES  Aged Out    Health Maintenance  Health Maintenance Due   Topic Date Due   Colonoscopy  03/31/2019    Colorectal cancer screening: Type of screening: Colonoscopy. Completed 03/30/2016. Repeat every 3 years  Lung Cancer Screening: (Low Dose CT Chest recommended if Age 41-80 years, 20 pack-year currently smoking OR have quit w/in 15years.) does not qualify.   Lung Cancer Screening Referral: no  Additional Screening:  Hepatitis C Screening: does qualify; Completed 02/06/2021  Vision Screening: Recommended annual ophthalmology exams for early detection of glaucoma and other disorders of the eye. Is the patient up to date with their annual eye exam?  Yes  Who is the provider or what is the name of the office in which the patient attends annual eye exams? Dr. Lorin Picket If pt is not established with a provider, would they like to be referred to a provider to establish care? No .   Dental Screening: Recommended annual dental exams for proper oral hygiene  Diabetic Foot Exam: Diabetic Foot Exam: Completed 07/18/2022  Community Resource Referral / Chronic Care Management: CRR required this visit?  No   CCM required this visit?  No     Plan:     I have personally reviewed and noted the following in the patient's chart:   Medical and social history Use of alcohol, tobacco or illicit drugs  Current medications and supplements including opioid prescriptions. Patient is not currently taking opioid prescriptions. Functional ability and status Nutritional status Physical activity Advanced directives List of other physicians Hospitalizations, surgeries, and ER visits in previous 12 months Vitals Screenings to include cognitive, depression, and falls Referrals and appointments  In addition, I have reviewed and discussed with patient certain preventive protocols, quality metrics, and best practice recommendations. A written personalized care plan for preventive services as well as general preventive health recommendations were provided to patient.      Barb Merino, LPN   [id number]   After Visit Summary: (MyChart) Due to this being a telephonic visit, the after visit summary with patients personalized plan was offered to patient via MyChart   Nurse Notes: none

## 2023-01-25 NOTE — Patient Instructions (Signed)
Ricardo Hahn , Thank you for taking time to come for your Medicare Wellness Visit. I appreciate your ongoing commitment to your health goals. Please review the following plan we discussed and let me know if I can assist you in the future.   Referrals/Orders/Follow-Ups/Clinician Recommendations: none  This is a list of the screening recommended for you and due dates:  Health Maintenance  Topic Date Due   Colon Cancer Screening  03/31/2019   COVID-19 Vaccine (1 - 2023-24 season) 02/10/2023*   Hemoglobin A1C  07/16/2023   Yearly kidney health urinalysis for diabetes  07/18/2023   Complete foot exam   07/18/2023   Eye exam for diabetics  11/22/2023   Yearly kidney function blood test for diabetes  01/16/2024   Medicare Annual Wellness Visit  01/25/2024   DTaP/Tdap/Td vaccine (2 - Td or Tdap) 12/12/2031   Pneumonia Vaccine  Completed   Flu Shot  Completed   Hepatitis C Screening  Completed   Zoster (Shingles) Vaccine  Completed   HPV Vaccine  Aged Out  *Topic was postponed. The date shown is not the original due date.    Advanced directives: (ACP Link)Information on Advanced Care Planning can be found at [facility] Advance Health Care Directives Advance Health Care Directives ([email or web address]   Next Medicare Annual Wellness Visit scheduled for next year: Yes  insert Preventive Care attachment Insert FALL PREVENTION attachment if needed

## 2023-01-27 ENCOUNTER — Other Ambulatory Visit: Payer: Self-pay | Admitting: Internal Medicine

## 2023-01-27 DIAGNOSIS — E785 Hyperlipidemia, unspecified: Secondary | ICD-10-CM

## 2023-01-27 DIAGNOSIS — E118 Type 2 diabetes mellitus with unspecified complications: Secondary | ICD-10-CM

## 2023-01-28 ENCOUNTER — Telehealth: Payer: Self-pay

## 2023-01-28 ENCOUNTER — Other Ambulatory Visit: Payer: Self-pay

## 2023-01-28 DIAGNOSIS — I10 Essential (primary) hypertension: Secondary | ICD-10-CM

## 2023-01-28 MED ORDER — INDAPAMIDE 1.25 MG PO TABS: 1.2500 mg | ORAL_TABLET | Freq: Every day | ORAL | 1 refills | Status: DC

## 2023-01-28 NOTE — Progress Notes (Signed)
   Care Guide Note  01/28/2023 Name: Ricardo Hahn MRN: [id number] DOB: [birth year]  Referred by: Etta Grandchild, MD Reason for referral : Care Coordination (Outreach to schedule with pharm d )   Ricardo Hahn is a 72 y.o. year old male who is a primary care patient of Etta Grandchild, MD. Ricardo Hahn was referred to the pharmacist for assistance related to DM.    Successful contact was made with the patient to discuss pharmacy services including being ready for the pharmacist to call at least 5 minutes before the scheduled appointment time, to have medication bottles and any blood sugar or blood pressure readings ready for review. The patient agreed to meet with the pharmacist via with the pharmacist via telephone visit on (date/time).  01/30/2023  Penne Lash , RMA     [place]  [facility] Guide  Direct Dial: [phone]  Website: [email or web address]

## 2023-01-30 ENCOUNTER — Other Ambulatory Visit: Payer: Medicare HMO

## 2023-01-30 DIAGNOSIS — E119 Type 2 diabetes mellitus without complications: Secondary | ICD-10-CM

## 2023-01-30 NOTE — Progress Notes (Signed)
   01/30/2023 Name: Ricardo Hahn MRN: [id number] DOB: 1950/08/27  Chief Complaint  Patient presents with   Diabetes   Medication Management    Ricardo Hahn is a 72 y.o. year old male who presented for a telephone visit.   They were referred to the pharmacist by their PCP for assistance in managing diabetes.    Subjective:  Care Team: Primary Care Provider: Etta Grandchild, MD ; Next Scheduled Visit: 05/16/2023  Medication Access/Adherence  Current Pharmacy:  [id number] [place], [place] - [address] [place] [address] Phone: [phone] Fax: [phone]  [facility] [place] - [address] [place] [address] Phone: [phone] Fax: [phone]  [facility] - [facility] [address] [place] [address] Phone: [phone] Fax: [phone]   Patient reports affordability concerns with their medications: Yes  Patient reports access/transportation concerns to their pharmacy: No  Patient reports adherence concerns with their medications:  Yes    Pt is in the donut hole and unable to afford Farxiga   Diabetes:  Current medications: farxiga 10 mg daily  He will run out of [place] around 12/16 after he uses up samples he got from the office.   Objective:  Lab Results  Component Value Date   HGBA1C 6.5 01/16/2023    Lab Results  Component Value Date   CREATININE 0.87 01/16/2023   BUN 14 01/16/2023   NA 139 01/16/2023   K 3.8 01/16/2023   CL 101 01/16/2023   CO2 29 01/16/2023    Lab Results  Component Value Date   CHOL 131 07/18/2022   HDL 37.80 (L) 07/18/2022   LDLCALC 64 07/18/2022   LDLDIRECT 174.0 02/06/2021   TRIG 146.0 07/18/2022   CHOLHDL 3 07/18/2022    Medications Reviewed Today   Medications were not reviewed in this encounter       Assessment/Plan:   Diabetes: - Currently controlled - Pt  does not meet income criteria to get Farxiga through PAP. Copay cards are not available to use by Medicare patients. - Since patient's BG have been very well controlled, I recommend talking 1/2 tablet daily for December or take it every other day until January when his Medicare will reset.    Follow Up Plan: PRN  Arbutus Leas, PharmD, BCPS, CPP Clinical Pharmacist Practitioner [facility] [phone]

## 2023-02-13 DIAGNOSIS — I110 Hypertensive heart disease with heart failure: Secondary | ICD-10-CM | POA: Diagnosis not present

## 2023-02-13 DIAGNOSIS — Z809 Family history of malignant neoplasm, unspecified: Secondary | ICD-10-CM | POA: Diagnosis not present

## 2023-02-13 DIAGNOSIS — I2510 Atherosclerotic heart disease of native coronary artery without angina pectoris: Secondary | ICD-10-CM | POA: Diagnosis not present

## 2023-02-13 DIAGNOSIS — Z8249 Family history of ischemic heart disease and other diseases of the circulatory system: Secondary | ICD-10-CM | POA: Diagnosis not present

## 2023-02-13 DIAGNOSIS — E1159 Type 2 diabetes mellitus with other circulatory complications: Secondary | ICD-10-CM | POA: Diagnosis not present

## 2023-02-13 DIAGNOSIS — E669 Obesity, unspecified: Secondary | ICD-10-CM | POA: Diagnosis not present

## 2023-02-13 DIAGNOSIS — Z008 Encounter for other general examination: Secondary | ICD-10-CM | POA: Diagnosis not present

## 2023-02-13 DIAGNOSIS — Z85828 Personal history of other malignant neoplasm of skin: Secondary | ICD-10-CM | POA: Diagnosis not present

## 2023-02-13 DIAGNOSIS — E785 Hyperlipidemia, unspecified: Secondary | ICD-10-CM | POA: Diagnosis not present

## 2023-02-13 DIAGNOSIS — M1990 Unspecified osteoarthritis, unspecified site: Secondary | ICD-10-CM | POA: Diagnosis not present

## 2023-02-13 DIAGNOSIS — Z87891 Personal history of nicotine dependence: Secondary | ICD-10-CM | POA: Diagnosis not present

## 2023-02-13 DIAGNOSIS — Z7984 Long term (current) use of oral hypoglycemic drugs: Secondary | ICD-10-CM | POA: Diagnosis not present

## 2023-02-13 DIAGNOSIS — I509 Heart failure, unspecified: Secondary | ICD-10-CM | POA: Diagnosis not present

## 2023-03-18 ENCOUNTER — Encounter: Payer: Self-pay | Admitting: Internal Medicine

## 2023-03-19 ENCOUNTER — Other Ambulatory Visit: Payer: Self-pay | Admitting: Pharmacist

## 2023-03-19 DIAGNOSIS — E1169 Type 2 diabetes mellitus with other specified complication: Secondary | ICD-10-CM

## 2023-03-19 MED ORDER — BEXAGLIFLOZIN 20 MG PO TABS: 20.0000 mg | ORAL_TABLET | Freq: Every morning | ORAL | 0 refills | Status: DC

## 2023-03-19 NOTE — Progress Notes (Signed)
   03/19/2023 Name: Ricardo Hahn MRN: [id number] DOB: August 07, 1950  Chief Complaint  Patient presents with   medication access    Ricardo Hahn is a 73 y.o. year old male who presented for a telephone visit.   They were referred to the pharmacist by their PCP for assistance in managing diabetes.    Subjective:  Care Team: Primary Care Provider: Joshua Debby CROME, MD ; Next Scheduled Visit: 05/16/2023  Medication Access/Adherence  Current Pharmacy:  [phone] - [place] [place] - [address] [place] [address] Phone: [phone] Fax: [phone]  [facility] - [place] - [address] [place] [address] Phone: [phone] Fax: [phone]   Patient reports affordability concerns with their medications: Yes  Patient reports access/transportation concerns to their pharmacy: No  Patient reports adherence concerns with their medications:  Yes      Diabetes:  Current medications: farxiga  10 mg daily - Farxiga  cost went up from $47 per 30 DS to $150 per 30DS this year  He would like to try Brenzavvy  which he can get through [facility] at a reduced cost   Objective:  Lab Results  Component Value Date   HGBA1C 6.5 01/16/2023    Lab Results  Component Value Date   CREATININE 0.87 01/16/2023   BUN 14 01/16/2023   NA 139 01/16/2023   K 3.8 01/16/2023   CL 101 01/16/2023   CO2 29 01/16/2023    Lab Results  Component Value Date   CHOL 131 07/18/2022   HDL 37.80 (L) 07/18/2022   LDLCALC 64 07/18/2022   LDLDIRECT 174.0 02/06/2021   TRIG 146.0 07/18/2022   CHOLHDL 3 07/18/2022    Medications Reviewed Today   Medications were not reviewed in this encounter       Assessment/Plan:   Diabetes: - Currently controlled - Pt does not meet income criteria to get Farxiga  through PAP. Copay cards are not available to use by Medicare patients. - Sent Rx for  Brenzavvy  20 mg daily to [facility]. D/c Farxiga     Follow Up Plan: PRN  Darrelyn Drum, PharmD, BCPS, CPP Clinical Pharmacist Practitioner [facility] [phone]

## 2023-04-04 ENCOUNTER — Other Ambulatory Visit: Payer: Self-pay | Admitting: Internal Medicine

## 2023-04-04 DIAGNOSIS — E1169 Type 2 diabetes mellitus with other specified complication: Secondary | ICD-10-CM

## 2023-04-04 DIAGNOSIS — I10 Essential (primary) hypertension: Secondary | ICD-10-CM

## 2023-04-28 ENCOUNTER — Other Ambulatory Visit: Payer: Self-pay | Admitting: Internal Medicine

## 2023-04-28 DIAGNOSIS — I10 Essential (primary) hypertension: Secondary | ICD-10-CM

## 2023-04-29 ENCOUNTER — Other Ambulatory Visit: Payer: Self-pay | Admitting: Internal Medicine

## 2023-04-29 DIAGNOSIS — I10 Essential (primary) hypertension: Secondary | ICD-10-CM

## 2023-05-16 ENCOUNTER — Ambulatory Visit (INDEPENDENT_AMBULATORY_CARE_PROVIDER_SITE_OTHER): Payer: Medicare HMO | Admitting: Internal Medicine

## 2023-05-16 ENCOUNTER — Other Ambulatory Visit: Payer: Self-pay

## 2023-05-16 ENCOUNTER — Encounter: Payer: Self-pay | Admitting: Internal Medicine

## 2023-05-16 VITALS — BP 136/76 | HR 61 | Temp 97.6°F | Resp 16 | Ht 68.0 in | Wt 202.2 lb

## 2023-05-16 DIAGNOSIS — I493 Ventricular premature depolarization: Secondary | ICD-10-CM

## 2023-05-16 DIAGNOSIS — E1169 Type 2 diabetes mellitus with other specified complication: Secondary | ICD-10-CM | POA: Diagnosis not present

## 2023-05-16 DIAGNOSIS — I10 Essential (primary) hypertension: Secondary | ICD-10-CM

## 2023-05-16 DIAGNOSIS — K635 Polyp of colon: Secondary | ICD-10-CM | POA: Insufficient documentation

## 2023-05-16 DIAGNOSIS — Z85828 Personal history of other malignant neoplasm of skin: Secondary | ICD-10-CM | POA: Insufficient documentation

## 2023-05-16 DIAGNOSIS — E785 Hyperlipidemia, unspecified: Secondary | ICD-10-CM

## 2023-05-16 DIAGNOSIS — R9431 Abnormal electrocardiogram [ECG] [EKG]: Secondary | ICD-10-CM | POA: Diagnosis not present

## 2023-05-16 LAB — BASIC METABOLIC PANEL
BUN: 15 mg/dL (ref 6–23)
CO2: 29 meq/L (ref 19–32)
Calcium: 9.2 mg/dL (ref 8.4–10.5)
Chloride: 100 meq/L (ref 96–112)
Creatinine, Ser: 0.98 mg/dL (ref 0.40–1.50)
GFR: 76.78 mL/min (ref >60.00)
Glucose, Bld: 104 mg/dL — ABNORMAL HIGH (ref 70–99)
Potassium: 3.6 meq/L (ref 3.5–5.1)
Sodium: 138 meq/L (ref 135–145)

## 2023-05-16 LAB — CBC WITH DIFFERENTIAL/PLATELET
Basophils Absolute: 0.1 10*3/uL (ref 0.0–0.1)
Basophils Relative: 1.2 % (ref 0.0–3.0)
Eosinophils Absolute: 0.4 10*3/uL (ref 0.0–0.7)
Eosinophils Relative: 4.9 % (ref 0.0–5.0)
HCT: 43.3 % (ref 39.0–52.0)
Hemoglobin: 14.4 g/dL (ref 13.0–17.0)
Lymphocytes Relative: 33.6 % (ref 12.0–46.0)
Lymphs Abs: 2.4 10*3/uL (ref 0.7–4.0)
MCHC: 33.3 g/dL (ref 30.0–36.0)
MCV: 91.4 fl (ref 78.0–100.0)
Monocytes Absolute: 0.7 10*3/uL (ref 0.1–1.0)
Monocytes Relative: 9.9 % (ref 3.0–12.0)
Neutro Abs: 3.6 10*3/uL (ref 1.4–7.7)
Neutrophils Relative %: 50.4 % (ref 43.0–77.0)
Platelets: 187 10*3/uL (ref 150.0–400.0)
RBC: 4.74 Mil/uL (ref 4.22–5.81)
RDW: 14.2 % (ref 11.5–15.5)
WBC: 7.2 10*3/uL (ref 4.0–10.5)

## 2023-05-16 LAB — HEMOGLOBIN A1C: Hgb A1c MFr Bld: 6.4 % (ref 4.6–6.5)

## 2023-05-16 LAB — HEPATIC FUNCTION PANEL
ALT: 15 U/L (ref 0–53)
AST: 15 U/L (ref 0–37)
Albumin: 4.4 g/dL (ref 3.5–5.2)
Alkaline Phosphatase: 36 U/L — ABNORMAL LOW (ref 39–117)
Bilirubin, Direct: 0.1 mg/dL (ref 0.0–0.3)
Total Bilirubin: 0.5 mg/dL (ref 0.2–1.2)
Total Protein: 7.4 g/dL (ref 6.0–8.3)

## 2023-05-16 LAB — TROPONIN I (HIGH SENSITIVITY): High Sens Troponin I: 6 ng/L (ref 2–17)

## 2023-05-16 LAB — MICROALBUMIN / CREATININE URINE RATIO
Creatinine,U: 117.2 mg/dL
Microalb Creat Ratio: 10.7 mg/g (ref 0.0–30.0)
Microalb, Ur: 1.3 mg/dL (ref 0.0–1.9)

## 2023-05-16 LAB — BRAIN NATRIURETIC PEPTIDE: Pro B Natriuretic peptide (BNP): 29 pg/mL (ref 0.0–100.0)

## 2023-05-16 MED ORDER — BEXAGLIFLOZIN 20 MG PO TABS: 20.0000 mg | ORAL_TABLET | Freq: Every morning | ORAL | 0 refills | Status: DC

## 2023-05-16 NOTE — Patient Instructions (Signed)
 Hypertension, Adult High blood pressure (hypertension) is when the force of blood pumping through the arteries is too strong. The arteries are the blood vessels that carry blood from the heart throughout the body. Hypertension forces the heart to work harder to pump blood and may cause arteries to become narrow or stiff. Untreated or uncontrolled hypertension can lead to a heart attack, heart failure, a stroke, kidney disease, and other problems. A blood pressure reading consists of a higher number over a lower number. Ideally, your blood pressure should be below 120/80. The first ("top") number is called the systolic pressure. It is a measure of the pressure in your arteries as your heart beats. The second ("bottom") number is called the diastolic pressure. It is a measure of the pressure in your arteries as the heart relaxes. What are the causes? The exact cause of this condition is not known. There are some conditions that result in high blood pressure. What increases the risk? Certain factors may make you more likely to develop high blood pressure. Some of these risk factors are under your control, including: Smoking. Not getting enough exercise or physical activity. Being overweight. Having too much fat, sugar, calories, or salt (sodium) in your diet. Drinking too much alcohol. Other risk factors include: Having a personal history of heart disease, diabetes, high cholesterol, or kidney disease. Stress. Having a family history of high blood pressure and high cholesterol. Having obstructive sleep apnea. Age. The risk increases with age. What are the signs or symptoms? High blood pressure may not cause symptoms. Very high blood pressure (hypertensive crisis) may cause: Headache. Fast or irregular heartbeats (palpitations). Shortness of breath. Nosebleed. Nausea and vomiting. Vision changes. Severe chest pain, dizziness, and seizures. How is this diagnosed? This condition is diagnosed by  measuring your blood pressure while you are seated, with your arm resting on a flat surface, your legs uncrossed, and your feet flat on the floor. The cuff of the blood pressure monitor will be placed directly against the skin of your upper arm at the level of your heart. Blood pressure should be measured at least twice using the same arm. Certain conditions can cause a difference in blood pressure between your right and left arms. If you have a high blood pressure reading during one visit or you have normal blood pressure with other risk factors, you may be asked to: Return on a different day to have your blood pressure checked again. Monitor your blood pressure at home for 1 week or longer. If you are diagnosed with hypertension, you may have other blood or imaging tests to help your health care provider understand your overall risk for other conditions. How is this treated? This condition is treated by making healthy lifestyle changes, such as eating healthy foods, exercising more, and reducing your alcohol intake. You may be referred for counseling on a healthy diet and physical activity. Your health care provider may prescribe medicine if lifestyle changes are not enough to get your blood pressure under control and if: Your systolic blood pressure is above 130. Your diastolic blood pressure is above 80. Your personal target blood pressure may vary depending on your medical conditions, your age, and other factors. Follow these instructions at home: Eating and drinking  Eat a diet that is high in fiber and potassium, and low in sodium, added sugar, and fat. An example of this eating plan is called the DASH diet. DASH stands for Dietary Approaches to Stop Hypertension. To eat this way: Eat  plenty of fresh fruits and vegetables. Try to fill one half of your plate at each meal with fruits and vegetables. Eat whole grains, such as whole-wheat pasta, brown rice, or whole-grain bread. Fill about one  fourth of your plate with whole grains. Eat or drink low-fat dairy products, such as skim milk or low-fat yogurt. Avoid fatty cuts of meat, processed or cured meats, and poultry with skin. Fill about one fourth of your plate with lean proteins, such as fish, chicken without skin, beans, eggs, or tofu. Avoid pre-made and processed foods. These tend to be higher in sodium, added sugar, and fat. Reduce your daily sodium intake. Many people with hypertension should eat less than 1,500 mg of sodium a day. Do not drink alcohol if: Your health care provider tells you not to drink. You are pregnant, may be pregnant, or are planning to become pregnant. If you drink alcohol: Limit how much you have to: 0-1 drink a day for women. 0-2 drinks a day for men. Know how much alcohol is in your drink. In the [place] one drink equals one 12 oz bottle of beer (355 mL), one 5 oz glass of wine (148 mL), or one 1 oz glass of hard liquor (44 mL). Lifestyle  Work with your health care provider to maintain a healthy body weight or to lose weight. Ask what an ideal weight is for you. Get at least 30 minutes of exercise that causes your heart to beat faster (aerobic exercise) most days of the week. Activities may include walking, swimming, or biking. Include exercise to strengthen your muscles (resistance exercise), such as Pilates or lifting weights, as part of your weekly exercise routine. Try to do these types of exercises for 30 minutes at least 3 days a week. Do not use any products that contain nicotine or tobacco. These products include cigarettes, chewing tobacco, and vaping devices, such as e-cigarettes. If you need help quitting, ask your health care provider. Monitor your blood pressure at home as told by your health care provider. Keep all follow-up visits. This is important. Medicines Take over-the-counter and prescription medicines only as told by your health care provider. Follow directions carefully. Blood  pressure medicines must be taken as prescribed. Do not skip doses of blood pressure medicine. Doing this puts you at risk for problems and can make the medicine less effective. Ask your health care provider about side effects or reactions to medicines that you should watch for. Contact a health care provider if you: Think you are having a reaction to a medicine you are taking. Have headaches that keep coming back (recurring). Feel dizzy. Have swelling in your ankles. Have trouble with your vision. Get help right away if you: Develop a severe headache or confusion. Have unusual weakness or numbness. Feel faint. Have severe pain in your chest or abdomen. Vomit repeatedly. Have trouble breathing. These symptoms may be an emergency. Get help right away. Call 911. Do not wait to see if the symptoms will go away. Do not drive yourself to the hospital. Summary Hypertension is when the force of blood pumping through your arteries is too strong. If this condition is not controlled, it may put you at risk for serious complications. Your personal target blood pressure may vary depending on your medical conditions, your age, and other factors. For most people, a normal blood pressure is less than 120/80. Hypertension is treated with lifestyle changes, medicines, or a combination of both. Lifestyle changes include losing weight, eating a healthy,  low-sodium diet, exercising more, and limiting alcohol. This information is not intended to replace advice given to you by your health care provider. Make sure you discuss any questions you have with your health care provider. Document Revised: 12/27/2020 Document Reviewed: 12/27/2020 Elsevier Patient Education  2024 [employer].

## 2023-05-16 NOTE — Progress Notes (Signed)
 Subjective:  Patient ID: Ricardo Hahn, male    DOB: 12-24-1950  Age: 73 y.o. MRN: [id number]  CC: Hypertension, Hyperlipidemia, and Diabetes   HPI Taytum Dilling presents for f/up --  Discussed the use of AI scribe software for clinical note transcription with the patient, who gave verbal consent to proceed.  History of Present Illness   The patient is a 73 year old with hypertension and a history of irregular heartbeats who presents for blood pressure management.  He is experiencing slightly elevated blood pressure, typically in the 140s to low 150s range, despite being on carvedilol, indapamide, and valsartan. No headache, blurred vision, chest pain, shortness of breath, dizziness, or lightheadedness.  He has a history of irregular heartbeats but has not seen a cardiologist for this issue. No symptoms associated with a low heart rate, such as dizziness or lightheadedness. His heart rate is normal at 66 beats per minute.       Outpatient Medications Prior to Visit  Medication Sig Dispense Refill   acetaminophen (TYLENOL) 500 MG tablet Take 500 mg by mouth every 6 (six) hours as needed.     carvedilol (COREG) 6.25 MG tablet TAKE 1 TABLET BY MOUTH 2 TIMES DAILY WITH A MEAL. 180 tablet 0   indapamide (LOZOL) 1.25 MG tablet Take 1 tablet (1.25 mg total) by mouth daily. 90 tablet 1   ketoconazole (NIZORAL) 2 % cream Apply 1 Application topically 2 (two) times daily. 60 g 2   Multiple Vitamin (MULTIVITAMIN) tablet Take 1 tablet by mouth daily.     rosuvastatin (CRESTOR) 20 MG tablet TAKE 1 TABLET BY MOUTH EVERY DAY 90 tablet 1   valsartan (DIOVAN) 320 MG tablet TAKE 1 TABLET BY MOUTH EVERY DAY 90 tablet 0   Bexagliflozin (BRENZAVVY) 20 MG TABS Take 20 mg by mouth every morning. 60 tablet 0   No facility-administered medications prior to visit.    ROS Review of Systems  Constitutional: Negative.  Negative for appetite change, fatigue and unexpected weight change.  HENT: Negative.     Eyes: Negative.  Negative for visual disturbance.  Respiratory: Negative.  Negative for cough, chest tightness, shortness of breath and wheezing.   Cardiovascular:  Negative for chest pain, palpitations and leg swelling.  Gastrointestinal: Negative.  Negative for abdominal pain, constipation, diarrhea, nausea and vomiting.  Genitourinary:  Negative for difficulty urinating.  Musculoskeletal: Negative.  Negative for arthralgias and neck pain.  Skin: Negative.   Neurological:  Negative for dizziness and weakness.  Hematological:  Negative for adenopathy. Does not bruise/bleed easily.  Psychiatric/Behavioral: Negative.      Objective:  BP 136/76 (BP Location: Left Arm, Patient Position: Sitting, Cuff Size: Large) Comment: BP (R) 138/76  Pulse 61   Temp 97.6 F (36.4 C) (Oral)   Resp 16   Ht 5\' 8"  (1.727 m)   Wt 202 lb 3.2 oz (91.7 kg)   SpO2 95%   BMI 30.74 kg/m   BP Readings from Last 3 Encounters:  05/16/23 136/76  01/16/23 (!) 148/80  07/18/22 (!) 144/82    Wt Readings from Last 3 Encounters:  05/16/23 202 lb 3.2 oz (91.7 kg)  01/16/23 208 lb 12.8 oz (94.7 kg)  07/18/22 210 lb (95.3 kg)    Physical Exam Vitals reviewed.  Constitutional:      Appearance: Normal appearance.  HENT:     Mouth/Throat:     Mouth: Mucous membranes are moist.  Eyes:     General: No scleral icterus.    Conjunctiva/sclera:  Conjunctivae normal.  Cardiovascular:     Rate and Rhythm: Normal rate and regular rhythm. Occasional Extrasystoles are present.    Heart sounds: No murmur heard.    No gallop.     Comments: EKG--- SR with frequent PVC's -> reciprocal ST changes, 66 bpm +++artifact Inferior infarct pattern is more pronounced Pulmonary:     Breath sounds: No stridor. No wheezing, rhonchi or rales.  Abdominal:     General: Abdomen is flat.     Palpations: There is no mass.     Tenderness: There is no abdominal tenderness. There is no guarding.     Hernia: No hernia is present.   Musculoskeletal:     Cervical back: Neck supple.     Right lower leg: No edema.     Left lower leg: No edema.  Lymphadenopathy:     Cervical: No cervical adenopathy.  Skin:    General: Skin is warm and dry.  Neurological:     General: No focal deficit present.     Mental Status: He is alert. Mental status is at baseline.  Psychiatric:        Mood and Affect: Mood normal.        Behavior: Behavior normal.     Lab Results  Component Value Date   WBC 7.2 05/16/2023   HGB 14.4 05/16/2023   HCT 43.3 05/16/2023   PLT 187.0 05/16/2023   GLUCOSE 104 (H) 05/16/2023   CHOL 131 07/18/2022   TRIG 146.0 07/18/2022   HDL 37.80 (L) 07/18/2022   LDLDIRECT 174.0 02/06/2021   LDLCALC 64 07/18/2022   ALT 15 05/16/2023   AST 15 05/16/2023   NA 138 05/16/2023   K 3.6 05/16/2023   CL 100 05/16/2023   CREATININE 0.98 05/16/2023   BUN 15 05/16/2023   CO2 29 05/16/2023   TSH 2.44 01/16/2023   PSA 0.58 07/18/2022   HGBA1C 6.4 05/16/2023   MICROALBUR 1.3 05/16/2023    CT CARDIAC SCORING ([facility] LOCATIONS ONLY) Result Date: 08/27/2022 CLINICAL DATA:  73 year old Caucasian male with history of hyperlipidemia and diabetes. * Tracking Code: FCC * EXAM: CT CARDIAC CORONARY ARTERY CALCIUM SCORE TECHNIQUE: Non-contrast imaging through the heart was performed using prospective ECG gating. Image post processing was performed on an independent workstation, allowing for quantitative analysis of the heart and coronary arteries. Note that this exam targets the heart and the chest was not imaged in its entirety. COMPARISON:  None available. FINDINGS: CORONARY CALCIUM SCORES: Left Main: 0 LAD: 20 LCx: 95 RCA: 0 Total Agatston Score: 114 MESA database percentile: 42nd AORTA MEASUREMENTS: Ascending Aorta: 3.8 cm Descending Aorta:2.8 cm OTHER FINDINGS: Atherosclerotic calcifications in the thoracic aorta. Calcifications of the aortic valve. Within the visualized portions of the thorax there are no suspicious appearing  pulmonary nodules or masses, there is no acute consolidative airspace disease, no pleural effusions, no pneumothorax and no lymphadenopathy. Visualized portions of the upper abdomen are unremarkable. There are no aggressive appearing lytic or blastic lesions noted in the visualized portions of the skeleton. IMPRESSION: 1. Patient's total coronary artery calcium score is 114 which is 42nd percentile for patient's of matched age, gender and race/ethnicity. Please note that although the presence of coronary artery calcium documents the presence of coronary artery disease, the severity of this disease and any potential stenosis cannot be assessed on this noncontrast CT examination. Assessment for potential risk factor modification, dietary therapy or pharmacologic therapy may be warranted, if clinically indicated. 2.  Aortic Atherosclerosis (ICD10-I70.0). 3. There  are mild calcifications of the aortic valve. Echocardiographic correlation for evaluation of potential valvular dysfunction may be warranted if clinically indicated. Electronically Signed   By: Trudie Reed M.D.   On: 08/27/2022 10:56    Assessment & Plan:   Primary hypertension- His BP is well controlled. -     Basic metabolic panel; Future -     CBC with Differential/Platelet; Future -     EKG 12-Lead -     Urinalysis, Routine w reflex microscopic; Future  Type 2 diabetes mellitus with other specified complication, without long-term current use of insulin (HCC)- His blood sugar is well controlled. -     Bexagliflozin; Take 20 mg by mouth every morning.  Dispense: 60 tablet; Refill: 0 -     Basic metabolic panel; Future -     Hemoglobin A1c; Future -     Microalbumin / creatinine urine ratio; Future -     Microalbumin / creatinine urine ratio; Future -     Urinalysis, Routine w reflex microscopic; Future  Hyperlipidemia LDL goal <100- LDL goal achieved. Doing well on the statin  -     Hepatic function panel; Future  Abnormal  electrocardiogram (ECG) (EKG)- Will evaluate with an ECHO. -     Troponin I (High Sensitivity); Future -     Brain natriuretic peptide; Future -     Ambulatory referral to Cardiology  Frequent unifocal PVCs -     Ambulatory referral to Cardiology  Sessile colonic polyp -     Ambulatory referral to Gastroenterology     Follow-up: Return in about 4 months (around 09/15/2023).  Sanda Linger, MD

## 2023-05-17 ENCOUNTER — Encounter: Payer: Self-pay | Admitting: Internal Medicine

## 2023-05-23 ENCOUNTER — Encounter (HOSPITAL_COMMUNITY): Payer: Self-pay

## 2023-05-29 ENCOUNTER — Encounter (HOSPITAL_COMMUNITY): Payer: Self-pay | Admitting: Internal Medicine

## 2023-06-29 ENCOUNTER — Other Ambulatory Visit: Payer: Self-pay | Admitting: Internal Medicine

## 2023-06-29 DIAGNOSIS — I10 Essential (primary) hypertension: Secondary | ICD-10-CM

## 2023-06-29 DIAGNOSIS — E1169 Type 2 diabetes mellitus with other specified complication: Secondary | ICD-10-CM

## 2023-07-15 ENCOUNTER — Other Ambulatory Visit: Payer: Self-pay | Admitting: Internal Medicine

## 2023-07-15 DIAGNOSIS — E1169 Type 2 diabetes mellitus with other specified complication: Secondary | ICD-10-CM

## 2023-07-16 MED ORDER — BEXAGLIFLOZIN 20 MG PO TABS: 20.0000 mg | ORAL_TABLET | Freq: Every morning | ORAL | 0 refills | Status: DC

## 2023-07-23 ENCOUNTER — Other Ambulatory Visit: Payer: Self-pay | Admitting: Internal Medicine

## 2023-07-23 DIAGNOSIS — E785 Hyperlipidemia, unspecified: Secondary | ICD-10-CM

## 2023-07-27 ENCOUNTER — Other Ambulatory Visit: Payer: Self-pay | Admitting: Internal Medicine

## 2023-07-27 DIAGNOSIS — I10 Essential (primary) hypertension: Secondary | ICD-10-CM

## 2023-08-04 ENCOUNTER — Other Ambulatory Visit: Payer: Self-pay | Admitting: Internal Medicine

## 2023-08-04 DIAGNOSIS — I10 Essential (primary) hypertension: Secondary | ICD-10-CM

## 2023-09-16 ENCOUNTER — Encounter: Payer: Self-pay | Admitting: Internal Medicine

## 2023-09-16 ENCOUNTER — Ambulatory Visit: Admitting: Internal Medicine

## 2023-09-19 ENCOUNTER — Other Ambulatory Visit: Payer: Self-pay | Admitting: Internal Medicine

## 2023-09-19 DIAGNOSIS — E1169 Type 2 diabetes mellitus with other specified complication: Secondary | ICD-10-CM

## 2023-09-20 ENCOUNTER — Other Ambulatory Visit: Payer: Self-pay

## 2023-09-20 DIAGNOSIS — E1169 Type 2 diabetes mellitus with other specified complication: Secondary | ICD-10-CM

## 2023-09-20 MED ORDER — BEXAGLIFLOZIN 20 MG PO TABS: 20.0000 mg | ORAL_TABLET | Freq: Every morning | ORAL | 1 refills | Status: DC

## 2023-10-02 ENCOUNTER — Ambulatory Visit (INDEPENDENT_AMBULATORY_CARE_PROVIDER_SITE_OTHER): Admitting: Internal Medicine

## 2023-10-02 ENCOUNTER — Encounter: Payer: Self-pay | Admitting: Internal Medicine

## 2023-10-02 VITALS — BP 142/68 | HR 73 | Temp 98.1°F | Resp 16 | Ht 68.0 in | Wt 198.4 lb

## 2023-10-02 DIAGNOSIS — E1169 Type 2 diabetes mellitus with other specified complication: Secondary | ICD-10-CM | POA: Diagnosis not present

## 2023-10-02 DIAGNOSIS — Z0001 Encounter for general adult medical examination with abnormal findings: Secondary | ICD-10-CM

## 2023-10-02 DIAGNOSIS — Z0000 Encounter for general adult medical examination without abnormal findings: Secondary | ICD-10-CM | POA: Diagnosis not present

## 2023-10-02 DIAGNOSIS — R3912 Poor urinary stream: Secondary | ICD-10-CM

## 2023-10-02 DIAGNOSIS — N401 Enlarged prostate with lower urinary tract symptoms: Secondary | ICD-10-CM | POA: Diagnosis not present

## 2023-10-02 DIAGNOSIS — I10 Essential (primary) hypertension: Secondary | ICD-10-CM | POA: Diagnosis not present

## 2023-10-02 DIAGNOSIS — E785 Hyperlipidemia, unspecified: Secondary | ICD-10-CM

## 2023-10-02 DIAGNOSIS — K635 Polyp of colon: Secondary | ICD-10-CM

## 2023-10-02 DIAGNOSIS — Z87898 Personal history of other specified conditions: Secondary | ICD-10-CM | POA: Insufficient documentation

## 2023-10-02 LAB — LIPID PANEL
Cholesterol: 156 mg/dL (ref 0–200)
HDL: 47.2 mg/dL (ref >39.00)
LDL Cholesterol: 85 mg/dL (ref 0–99)
NonHDL: 108.38
Total CHOL/HDL Ratio: 3
Triglycerides: 116 mg/dL (ref 0.0–149.0)
VLDL: 23.2 mg/dL (ref 0.0–40.0)

## 2023-10-02 LAB — BASIC METABOLIC PANEL WITH GFR
BUN: 16 mg/dL (ref 6–23)
CO2: 30 meq/L (ref 19–32)
Calcium: 9.7 mg/dL (ref 8.4–10.5)
Chloride: 100 meq/L (ref 96–112)
Creatinine, Ser: 0.84 mg/dL (ref 0.40–1.50)
GFR: 86.6 mL/min (ref >60.00)
Glucose, Bld: 113 mg/dL — ABNORMAL HIGH (ref 70–99)
Potassium: 3.7 meq/L (ref 3.5–5.1)
Sodium: 140 meq/L (ref 135–145)

## 2023-10-02 LAB — HEMOGLOBIN A1C: Hgb A1c MFr Bld: 6.5 % (ref 4.6–6.5)

## 2023-10-02 LAB — PSA: PSA: 0.77 ng/mL (ref 0.10–4.00)

## 2023-10-02 NOTE — Progress Notes (Unsigned)
 Subjective:  Patient ID: Ricardo Hahn, male    DOB: 10-Oct-1950  Age: 73 y.o. MRN: [id number]  CC: Annual Exam, Hypertension, Hyperlipidemia, and Diabetes   HPI Ricardo Hahn presents for a CPX and f/up ----  Discussed the use of AI scribe software for clinical note transcription with the patient, who gave verbal consent to proceed.  History of Present Illness Ricardo Hahn is a 73 year old male who presents for an annual physical exam.  He has no new or different health issues since his last visit. No chest pain, shortness of breath, dizziness, lightheadedness, headaches, or blurred vision. His blood pressure reading was 168/74, but he questions its accuracy and is open to having it rechecked.  He had an eye exam last fall and a colonoscopy in 2018, which was normal.  He is staying active and reports no health concerns. He recently became a great-grandfather, as his granddaughter had a baby last Friday.    Outpatient Medications Prior to Visit  Medication Sig Dispense Refill   acetaminophen (TYLENOL) 500 MG tablet Take 500 mg by mouth every 6 (six) hours as needed.     Bexagliflozin  (BRENZAVVY ) 20 MG TABS Take 20 mg by mouth every morning. 90 tablet 1   carvedilol  (COREG ) 6.25 MG tablet TAKE 1 TABLET BY MOUTH TWICE A DAY WITH FOOD 180 tablet 0   indapamide  (LOZOL ) 1.25 MG tablet TAKE 1 TABLET BY MOUTH DAILY. 90 tablet 1   ketoconazole  (NIZORAL ) 2 % cream Apply 1 Application topically 2 (two) times daily. 60 g 2   Multiple Vitamin (MULTIVITAMIN) tablet Take 1 tablet by mouth daily.     rosuvastatin  (CRESTOR ) 20 MG tablet TAKE 1 TABLET BY MOUTH EVERY DAY 90 tablet 1   valsartan  (DIOVAN ) 320 MG tablet TAKE 1 TABLET BY MOUTH EVERY DAY 90 tablet 0   No facility-administered medications prior to visit.    ROS Review of Systems  Constitutional:  Negative for appetite change, chills, diaphoresis, fatigue and fever.  HENT: Negative.    Eyes: Negative.   Respiratory: Negative.   Negative for cough, chest tightness, shortness of breath and wheezing.   Cardiovascular:  Negative for chest pain, palpitations and leg swelling.  Gastrointestinal: Negative.  Negative for abdominal pain, constipation, diarrhea, nausea and vomiting.  Endocrine: Negative.   Genitourinary:  Positive for difficulty urinating. Negative for dysuria, hematuria, penile swelling, scrotal swelling and testicular pain.  Musculoskeletal: Negative.   Neurological:  Negative for dizziness and weakness.  Hematological:  Negative for adenopathy. Does not bruise/bleed easily.  Psychiatric/Behavioral: Negative.      Objective:  BP (!) 142/68 (BP Location: Left Arm, Patient Position: Sitting, Cuff Size: Normal)   Pulse 73   Temp 98.1 F (36.7 C) (Oral)   Resp 16   Ht 5' 8 (1.727 m)   Wt 198 lb 6.4 oz (90 kg)   SpO2 95%   BMI 30.17 kg/m   BP Readings from Last 3 Encounters:  10/02/23 (!) 142/68  05/16/23 136/76  01/16/23 (!) 148/80    Wt Readings from Last 3 Encounters:  10/02/23 198 lb 6.4 oz (90 kg)  05/16/23 202 lb 3.2 oz (91.7 kg)  01/16/23 208 lb 12.8 oz (94.7 kg)    Physical Exam Vitals reviewed.  Constitutional:      Appearance: Normal appearance.  HENT:     Nose: Nose normal.     Mouth/Throat:     Mouth: Mucous membranes are moist.  Eyes:     General: No scleral icterus.  Conjunctiva/sclera: Conjunctivae normal.  Cardiovascular:     Rate and Rhythm: Normal rate and regular rhythm. Occasional Extrasystoles are present.    Heart sounds: No murmur heard.    No friction rub. No gallop.  Pulmonary:     Effort: Pulmonary effort is normal.     Breath sounds: No stridor. No wheezing, rhonchi or rales.  Abdominal:     General: Abdomen is flat.     Palpations: There is no mass.     Tenderness: There is no abdominal tenderness. There is no guarding.     Hernia: No hernia is present. There is no hernia in the left inguinal area or right inguinal area.  Genitourinary:    Pubic  Area: No rash.      Penis: Normal and uncircumcised. No phimosis, paraphimosis, erythema or discharge.      Testes: Cremasteric reflex is present.     Epididymis:     Right: Normal.     Left: Normal.     Prostate: Enlarged. Not tender and no nodules present.     Rectum: Guaiac result negative. No mass, tenderness, anal fissure, external hemorrhoid or internal hemorrhoid. Normal anal tone.  Musculoskeletal:        General: Normal range of motion.     Cervical back: Neck supple.     Right lower leg: No edema.     Left lower leg: No edema.  Lymphadenopathy:     Cervical: No cervical adenopathy.     Lower Body: No right inguinal adenopathy. No left inguinal adenopathy.  Skin:    General: Skin is warm and dry.  Neurological:     General: No focal deficit present.     Mental Status: He is alert. Mental status is at baseline.  Psychiatric:        Mood and Affect: Mood normal.        Behavior: Behavior normal.     Lab Results  Component Value Date   WBC 7.2 05/16/2023   HGB 14.4 05/16/2023   HCT 43.3 05/16/2023   PLT 187.0 05/16/2023   GLUCOSE 113 (H) 10/02/2023   CHOL 156 10/02/2023   TRIG 116.0 10/02/2023   HDL 47.20 10/02/2023   LDLDIRECT 174.0 02/06/2021   LDLCALC 85 10/02/2023   ALT 15 05/16/2023   AST 15 05/16/2023   NA 140 10/02/2023   K 3.7 10/02/2023   CL 100 10/02/2023   CREATININE 0.84 10/02/2023   BUN 16 10/02/2023   CO2 30 10/02/2023   TSH 2.44 01/16/2023   PSA 0.77 10/02/2023   HGBA1C 6.5 10/02/2023   MICROALBUR 1.3 05/16/2023    CT CARDIAC SCORING ([facility] LOCATIONS ONLY) Result Date: 08/27/2022 CLINICAL DATA:  73 year old Caucasian male with history of hyperlipidemia and diabetes. * Tracking Code: FCC * EXAM: CT CARDIAC CORONARY ARTERY CALCIUM  SCORE TECHNIQUE: Non-contrast imaging through the heart was performed using prospective ECG gating. Image post processing was performed on an independent workstation, allowing for quantitative analysis of the heart and  coronary arteries. Note that this exam targets the heart and the chest was not imaged in its entirety. COMPARISON:  None available. FINDINGS: CORONARY CALCIUM  SCORES: Left Main: 0 LAD: 20 LCx: 95 RCA: 0 Total Agatston Score: 114 MESA database percentile: 42nd AORTA MEASUREMENTS: Ascending Aorta: 3.8 cm Descending Aorta:2.8 cm OTHER FINDINGS: Atherosclerotic calcifications in the thoracic aorta. Calcifications of the aortic valve. Within the visualized portions of the thorax there are no suspicious appearing pulmonary nodules or masses, there is no acute consolidative airspace disease,  no pleural effusions, no pneumothorax and no lymphadenopathy. Visualized portions of the upper abdomen are unremarkable. There are no aggressive appearing lytic or blastic lesions noted in the visualized portions of the skeleton. IMPRESSION: 1. Patient's total coronary artery calcium  score is 114 which is 42nd percentile for patient's of matched age, gender and race/ethnicity. Please note that although the presence of coronary artery calcium  documents the presence of coronary artery disease, the severity of this disease and any potential stenosis cannot be assessed on this noncontrast CT examination. Assessment for potential risk factor modification, dietary therapy or pharmacologic therapy may be warranted, if clinically indicated. 2.  Aortic Atherosclerosis (ICD10-I70.0). 3. There are mild calcifications of the aortic valve. Echocardiographic correlation for evaluation of potential valvular dysfunction may be warranted if clinically indicated. Electronically Signed   By: Toribio Aye M.D.   On: 08/27/2022 10:56    Assessment & Plan:  Encounter for general adult medical examination with abnormal findings  Hyperlipidemia LDL goal <100 -     Lipid panel; Future  Type 2 diabetes mellitus with other specified complication, without long-term current use of insulin (HCC) -     Basic metabolic panel with GFR; Future -      Hemoglobin A1c; Future -     HM Diabetes Foot Exam  Sessile colonic polyp -     Ambulatory referral to Gastroenterology  Benign prostatic hyperplasia with weak urinary stream -     PSA; Future  Primary hypertension     Follow-up: Return in about 6 months (around 04/03/2024).  Debby Molt, MD

## 2023-10-02 NOTE — Patient Instructions (Signed)
 Health Maintenance, Male  Adopting a healthy lifestyle and getting preventive care are important in promoting health and wellness. Ask your health care provider about:  The right schedule for you to have regular tests and exams.  Things you can do on your own to prevent diseases and keep yourself healthy.  What should I know about diet, weight, and exercise?  Eat a healthy diet    Eat a diet that includes plenty of vegetables, fruits, low-fat dairy products, and lean protein.  Do not eat a lot of foods that are high in solid fats, added sugars, or sodium.  Maintain a healthy weight  Body mass index (BMI) is a measurement that can be used to identify possible weight problems. It estimates body fat based on height and weight. Your health care provider can help determine your BMI and help you achieve or maintain a healthy weight.  Get regular exercise  Get regular exercise. This is one of the most important things you can do for your health. Most adults should:  Exercise for at least 150 minutes each week. The exercise should increase your heart rate and make you sweat (moderate-intensity exercise).  Do strengthening exercises at least twice a week. This is in addition to the moderate-intensity exercise.  Spend less time sitting. Even light physical activity can be beneficial.  Watch cholesterol and blood lipids  Have your blood tested for lipids and cholesterol at 73 years of age, then have this test every 5 years.  You may need to have your cholesterol levels checked more often if:  Your lipid or cholesterol levels are high.  You are older than 73 years of age.  You are at high risk for heart disease.  What should I know about cancer screening?  Many types of cancers can be detected early and may often be prevented. Depending on your health history and family history, you may need to have cancer screening at various ages. This may include screening for:  Colorectal cancer.  Prostate cancer.  Skin cancer.  Lung  cancer.  What should I know about heart disease, diabetes, and high blood pressure?  Blood pressure and heart disease  High blood pressure causes heart disease and increases the risk of stroke. This is more likely to develop in people who have high blood pressure readings or are overweight.  Talk with your health care provider about your target blood pressure readings.  Have your blood pressure checked:  Every 3-5 years if you are 9-95 years of age.  Every year if you are 85 years old or older.  If you are between the ages of 29 and 29 and are a current or former smoker, ask your health care provider if you should have a one-time screening for abdominal aortic aneurysm (AAA).  Diabetes  Have regular diabetes screenings. This checks your fasting blood sugar level. Have the screening done:  Once every three years after age 23 if you are at a normal weight and have a low risk for diabetes.  More often and at a younger age if you are overweight or have a high risk for diabetes.  What should I know about preventing infection?  Hepatitis B  If you have a higher risk for hepatitis B, you should be screened for this virus. Talk with your health care provider to find out if you are at risk for hepatitis B infection.  Hepatitis C  Blood testing is recommended for:  Everyone born from [birth year] through 1965.  Anyone  with known risk factors for hepatitis C.  Sexually transmitted infections (STIs)  You should be screened each year for STIs, including gonorrhea and chlamydia, if:  You are sexually active and are younger than 73 years of age.  You are older than 73 years of age and your health care provider tells you that you are at risk for this type of infection.  Your sexual activity has changed since you were last screened, and you are at increased risk for chlamydia or gonorrhea. Ask your health care provider if you are at risk.  Ask your health care provider about whether you are at high risk for HIV. Your health care provider  may recommend a prescription medicine to help prevent HIV infection. If you choose to take medicine to prevent HIV, you should first get tested for HIV. You should then be tested every 3 months for as long as you are taking the medicine.  Follow these instructions at home:  Alcohol use  Do not drink alcohol if your health care provider tells you not to drink.  If you drink alcohol:  Limit how much you have to 0-2 drinks a day.  Know how much alcohol is in your drink. In the [place] one drink equals one 12 oz bottle of beer (355 mL), one 5 oz glass of wine (148 mL), or one 1 oz glass of hard liquor (44 mL).  Lifestyle  Do not use any products that contain nicotine or tobacco. These products include cigarettes, chewing tobacco, and vaping devices, such as e-cigarettes. If you need help quitting, ask your health care provider.  Do not use street drugs.  Do not share needles.  Ask your health care provider for help if you need support or information about quitting drugs.  General instructions  Schedule regular health, dental, and eye exams.  Stay current with your vaccines.  Tell your health care provider if:  You often feel depressed.  You have ever been abused or do not feel safe at home.  Summary  Adopting a healthy lifestyle and getting preventive care are important in promoting health and wellness.  Follow your health care provider's instructions about healthy diet, exercising, and getting tested or screened for diseases.  Follow your health care provider's instructions on monitoring your cholesterol and blood pressure.  This information is not intended to replace advice given to you by your health care provider. Make sure you discuss any questions you have with your health care provider.  Document Revised: 07/11/2020 Document Reviewed: 07/11/2020  Elsevier Patient Education  2024 [employer].

## 2023-10-03 ENCOUNTER — Ambulatory Visit: Payer: Self-pay | Admitting: Internal Medicine

## 2023-11-01 ENCOUNTER — Other Ambulatory Visit: Payer: Self-pay

## 2023-11-01 ENCOUNTER — Encounter: Payer: Self-pay | Admitting: Internal Medicine

## 2023-11-01 ENCOUNTER — Ambulatory Visit (AMBULATORY_SURGERY_CENTER)

## 2023-11-01 VITALS — Ht 68.0 in | Wt 195.0 lb

## 2023-11-01 DIAGNOSIS — Z860100 Personal history of colon polyps, unspecified: Secondary | ICD-10-CM

## 2023-11-01 MED ORDER — NA SULFATE-K SULFATE-MG SULF 17.5-3.13-1.6 GM/177ML PO SOLN
1.0000 | Freq: Once | ORAL | 0 refills | Status: AC
Start: 2023-11-01 — End: 2023-11-01

## 2023-11-01 NOTE — Progress Notes (Signed)
 Denies allergies to eggs or soy products. Denies complication of anesthesia or sedation. Denies use of weight loss medication. Denies use of O2.   Emmi instructions given for colonoscopy.

## 2023-11-06 ENCOUNTER — Other Ambulatory Visit: Payer: Self-pay | Admitting: Internal Medicine

## 2023-11-06 DIAGNOSIS — I10 Essential (primary) hypertension: Secondary | ICD-10-CM

## 2023-11-08 ENCOUNTER — Encounter: Payer: Self-pay | Admitting: Internal Medicine

## 2023-11-11 ENCOUNTER — Other Ambulatory Visit: Payer: Self-pay

## 2023-11-11 DIAGNOSIS — I10 Essential (primary) hypertension: Secondary | ICD-10-CM

## 2023-11-11 DIAGNOSIS — Z008 Encounter for other general examination: Secondary | ICD-10-CM | POA: Diagnosis not present

## 2023-11-11 MED ORDER — CARVEDILOL 6.25 MG PO TABS: 6.2500 mg | ORAL_TABLET | Freq: Two times a day (BID) | ORAL | 1 refills | Status: AC

## 2023-11-14 DIAGNOSIS — C44619 Basal cell carcinoma of skin of left upper limb, including shoulder: Secondary | ICD-10-CM | POA: Diagnosis not present

## 2023-11-14 DIAGNOSIS — R208 Other disturbances of skin sensation: Secondary | ICD-10-CM | POA: Diagnosis not present

## 2023-11-14 DIAGNOSIS — R58 Hemorrhage, not elsewhere classified: Secondary | ICD-10-CM | POA: Diagnosis not present

## 2023-11-14 DIAGNOSIS — D485 Neoplasm of uncertain behavior of skin: Secondary | ICD-10-CM | POA: Diagnosis not present

## 2023-11-14 DIAGNOSIS — L578 Other skin changes due to chronic exposure to nonionizing radiation: Secondary | ICD-10-CM | POA: Diagnosis not present

## 2023-11-14 DIAGNOSIS — L570 Actinic keratosis: Secondary | ICD-10-CM | POA: Diagnosis not present

## 2023-12-02 ENCOUNTER — Other Ambulatory Visit: Payer: Self-pay | Admitting: General Surgery

## 2023-12-02 DIAGNOSIS — C44629 Squamous cell carcinoma of skin of left upper limb, including shoulder: Secondary | ICD-10-CM | POA: Diagnosis not present

## 2023-12-05 ENCOUNTER — Other Ambulatory Visit: Payer: Self-pay | Admitting: Internal Medicine

## 2023-12-05 DIAGNOSIS — E1169 Type 2 diabetes mellitus with other specified complication: Secondary | ICD-10-CM

## 2023-12-05 DIAGNOSIS — I10 Essential (primary) hypertension: Secondary | ICD-10-CM

## 2023-12-07 ENCOUNTER — Other Ambulatory Visit: Payer: Self-pay | Admitting: Internal Medicine

## 2023-12-07 DIAGNOSIS — I10 Essential (primary) hypertension: Secondary | ICD-10-CM

## 2023-12-09 ENCOUNTER — Other Ambulatory Visit: Payer: Self-pay

## 2023-12-09 ENCOUNTER — Encounter: Payer: Self-pay | Admitting: Internal Medicine

## 2023-12-09 DIAGNOSIS — E1169 Type 2 diabetes mellitus with other specified complication: Secondary | ICD-10-CM

## 2023-12-09 DIAGNOSIS — I10 Essential (primary) hypertension: Secondary | ICD-10-CM

## 2023-12-09 MED ORDER — VALSARTAN 320 MG PO TABS: 320.0000 mg | ORAL_TABLET | Freq: Every day | ORAL | 1 refills | Status: AC

## 2023-12-10 ENCOUNTER — Encounter: Payer: Self-pay | Admitting: Internal Medicine

## 2023-12-10 ENCOUNTER — Ambulatory Visit: Admitting: Internal Medicine

## 2023-12-10 VITALS — BP 140/40 | HR 82 | Temp 99.0°F | Resp 16 | Ht 68.0 in | Wt 195.0 lb

## 2023-12-10 DIAGNOSIS — I10 Essential (primary) hypertension: Secondary | ICD-10-CM | POA: Diagnosis not present

## 2023-12-10 DIAGNOSIS — D123 Benign neoplasm of transverse colon: Secondary | ICD-10-CM | POA: Diagnosis not present

## 2023-12-10 DIAGNOSIS — Z1211 Encounter for screening for malignant neoplasm of colon: Secondary | ICD-10-CM | POA: Diagnosis not present

## 2023-12-10 DIAGNOSIS — K635 Polyp of colon: Secondary | ICD-10-CM | POA: Diagnosis not present

## 2023-12-10 DIAGNOSIS — D120 Benign neoplasm of cecum: Secondary | ICD-10-CM

## 2023-12-10 DIAGNOSIS — D122 Benign neoplasm of ascending colon: Secondary | ICD-10-CM

## 2023-12-10 DIAGNOSIS — Z860101 Personal history of adenomatous and serrated colon polyps: Secondary | ICD-10-CM

## 2023-12-10 DIAGNOSIS — K648 Other hemorrhoids: Secondary | ICD-10-CM | POA: Diagnosis not present

## 2023-12-10 DIAGNOSIS — E119 Type 2 diabetes mellitus without complications: Secondary | ICD-10-CM | POA: Diagnosis not present

## 2023-12-10 DIAGNOSIS — E785 Hyperlipidemia, unspecified: Secondary | ICD-10-CM | POA: Diagnosis not present

## 2023-12-10 DIAGNOSIS — K5730 Diverticulosis of large intestine without perforation or abscess without bleeding: Secondary | ICD-10-CM

## 2023-12-10 DIAGNOSIS — Z860100 Personal history of colon polyps, unspecified: Secondary | ICD-10-CM

## 2023-12-10 DIAGNOSIS — Z800 Family history of malignant neoplasm of digestive organs: Secondary | ICD-10-CM

## 2023-12-10 MED ORDER — SODIUM CHLORIDE 0.9 % IV SOLN: 500.0000 mL | Freq: Once | INTRAVENOUS | Status: DC

## 2023-12-10 NOTE — Progress Notes (Signed)
 Sedate, gd SR, tolerated procedure well, VSS, report to RN

## 2023-12-10 NOTE — Patient Instructions (Signed)

## 2023-12-10 NOTE — Progress Notes (Signed)
 GASTROENTEROLOGY PROCEDURE H&P NOTE   Primary Care Physician: Joshua Debby CROME, MD    Reason for Procedure:  History of colon polyps and family history of colon cancer  Plan:    Surveillance colonoscopy  Patient is appropriate for endoscopic procedure(s) in the ambulatory ([facility]) setting.  The nature of the procedure, as well as the risks, benefits, and alternatives were carefully and thoroughly reviewed with the patient. Ample time for discussion and questions allowed. The patient understood, was satisfied, and agreed to proceed.     HPI: Ricardo Hahn is a 73 y.o. male who presents for surveillance colonoscopy.  Medical history as below.  Tolerated the prep.  No recent chest pain or shortness of breath.  No abdominal pain today.  Past Medical History:  Diagnosis Date   Allergy    seasonal   Arthritis    Cancer (HCC)    Cataract    Diabetes mellitus without complication (HCC)    Hyperlipidemia    Hypertension     Past Surgical History:  Procedure Laterality Date   ingunial hernia repair Left 03/05/1973   SKIN CANCER EXCISION      Prior to Admission medications   Medication Sig Start Date End Date Taking? Authorizing Provider  acetaminophen (TYLENOL) 500 MG tablet Take 500 mg by mouth every 6 (six) hours as needed.   Yes [provider]  Bexagliflozin  (BRENZAVVY ) 20 MG TABS Take 20 mg by mouth every morning. 09/20/23  Yes Joshua Debby CROME, MD  carvedilol  (COREG ) 6.25 MG tablet Take 1 tablet (6.25 mg total) by mouth 2 (two) times daily with a meal. 11/11/23  Yes Joshua Debby CROME, MD  indapamide  (LOZOL ) 1.25 MG tablet TAKE 1 TABLET BY MOUTH DAILY. 07/30/23  Yes Joshua Debby CROME, MD  Multiple Vitamin (MULTIVITAMIN) tablet Take 1 tablet by mouth daily.   Yes [provider]  Na Sulfate-K Sulfate-Mg Sulfate concentrate (SUPREP) 17.5-3.13-1.6 GM/177ML SOLN TAKE 1 KIT (354 MLS TOTAL) BY MOUTH ONCE FOR 1 DOSE. MAY USE GENERIC SUPREP USE GOOD RX COUPON 11/01/23  Yes  [provider]  rosuvastatin  (CRESTOR ) 20 MG tablet TAKE 1 TABLET BY MOUTH EVERY DAY 07/23/23  Yes Joshua Debby CROME, MD  valsartan  (DIOVAN ) 320 MG tablet Take 1 tablet (320 mg total) by mouth daily. 12/09/23  Yes Joshua Debby CROME, MD  aspirin EC 81 MG tablet Take 81 mg by mouth daily. Swallow whole.    [provider]  ketoconazole  (NIZORAL ) 2 % cream Apply 1 Application topically 2 (two) times daily. Patient not taking: Reported on 11/01/2023 09/26/21   Joshua Debby CROME, MD    Current Outpatient Medications  Medication Sig Dispense Refill   acetaminophen (TYLENOL) 500 MG tablet Take 500 mg by mouth every 6 (six) hours as needed.     Bexagliflozin  (BRENZAVVY ) 20 MG TABS Take 20 mg by mouth every morning. 90 tablet 1   carvedilol  (COREG ) 6.25 MG tablet Take 1 tablet (6.25 mg total) by mouth 2 (two) times daily with a meal. 180 tablet 1   indapamide  (LOZOL ) 1.25 MG tablet TAKE 1 TABLET BY MOUTH DAILY. 90 tablet 1   Multiple Vitamin (MULTIVITAMIN) tablet Take 1 tablet by mouth daily.     Na Sulfate-K Sulfate-Mg Sulfate concentrate (SUPREP) 17.5-3.13-1.6 GM/177ML SOLN TAKE 1 KIT (354 MLS TOTAL) BY MOUTH ONCE FOR 1 DOSE. MAY USE GENERIC SUPREP USE GOOD RX COUPON     rosuvastatin  (CRESTOR ) 20 MG tablet TAKE 1 TABLET BY MOUTH EVERY DAY 90 tablet 1  valsartan  (DIOVAN ) 320 MG tablet Take 1 tablet (320 mg total) by mouth daily. 90 tablet 1   aspirin EC 81 MG tablet Take 81 mg by mouth daily. Swallow whole.     ketoconazole  (NIZORAL ) 2 % cream Apply 1 Application topically 2 (two) times daily. (Patient not taking: Reported on 11/01/2023) 60 g 2   Current Facility-Administered Medications  Medication Dose Route Frequency Provider Last Rate Last Admin   0.9 %  sodium chloride  infusion  500 mL Intravenous Once Amylah Will, Gordy HERO, MD        Allergies as of 12/10/2023 - Review Complete 12/10/2023  Allergen Reaction Noted   Shellfish allergy Shortness Of Breath 12/15/2012    Family History   Problem Relation Age of Onset   Hypertension Mother    Stroke Mother    Kidney failure Mother    Colon cancer Father 27   Coronary artery disease Father    Colon cancer Brother 56   Colon cancer Paternal Grandfather 3   Esophageal cancer Neg Hx    Rectal cancer Neg Hx    Stomach cancer Neg Hx     Social History   Socioeconomic History   Marital status: Married    Spouse name: Ricardo Hahn   Number of children: Not on file   Years of education: Not on file   Highest education level: Some college, no degree  Occupational History   Not on file  Tobacco Use   Smoking status: Former    Current packs/day: 0.00    Types: Cigarettes    Quit date: 12/15/1973    Years since quitting: 50.0    Passive exposure: Past   Smokeless tobacco: Current    Types: Snuff  Vaping Use   Vaping status: Never Used  Substance and Sexual Activity   Alcohol use: No   Drug use: No   Sexual activity: Yes    Partners: Female  Other Topics Concern   Not on file  Social History Narrative   Not on file   Social Drivers of Health   Financial Resource Strain: Medium Risk (09/30/2023)   Overall Financial Resource Strain (CARDIA)    Difficulty of Paying Living Expenses: Somewhat hard  Food Insecurity: Patient Declined (09/30/2023)   Hunger Vital Sign    Worried About Running Out of Food in the Last Year: Patient declined    Ran Out of Food in the Last Year: Patient declined  Transportation Needs: No Transportation Needs (09/30/2023)   PRAPARE - Administrator, Civil Service (Medical): No    Lack of Transportation (Non-Medical): No  Physical Activity: Insufficiently Active (09/30/2023)   Exercise Vital Sign    Days of Exercise per Week: 2 days    Minutes of Exercise per Session: 20 min  Stress: No Stress Concern Present (09/30/2023)   [employer] of Occupational Health - Occupational Stress Questionnaire    Feeling of Stress: Not at all  Social Connections: Moderately Integrated  (09/30/2023)   Social Connection and Isolation Panel    Frequency of Communication with Friends and Family: More than three times a week    Frequency of Social Gatherings with Friends and Family: Twice a week    Attends Religious Services: More than 4 times per year    Active Member of [employer] or Organizations: No    Attends Banker Meetings: Not on file    Marital Status: Married  Intimate Partner Violence: Not At Risk (01/25/2023)   Humiliation, Afraid, Rape, and Kick questionnaire  Fear of Current or Ex-Partner: No    Emotionally Abused: No    Physically Abused: No    Sexually Abused: No    Physical Exam: Vital signs in last 24 hours: @BP  (!) 144/76   Pulse 61   Temp 99 F (37.2 C) (Temporal)   Ht 5' 8 (1.727 m)   Wt 195 lb (88.5 kg)   SpO2 95%   BMI 29.65 kg/m  GEN: NAD EYE: Sclerae anicteric ENT: MMM CV: Non-tachycardic Pulm: CTA b/l GI: Soft, NT/ND NEURO:  Alert & Oriented x 3   Gordy Starch, MD [facility]  12/10/2023 2:10 PM

## 2023-12-10 NOTE — Op Note (Signed)
 [facility] Patient Name: Ricardo Hahn Procedure Date: 12/10/2023 2:07 PM MRN: [id number] Endoscopist: Gordy CHRISTELLA Starch , MD, [id number] Age: 73 Referring MD:  Date of Birth: 1951-01-13 Gender: Male Account #: [id number] Procedure:                Colonoscopy Indications:              High risk colon cancer surveillance: Personal                            history of multiple adenomas, Family history of                            colon cancer in multiple first-degree relatives                            (father, brother), Last colonoscopy: January 2018                            (TA x5), Oct 2014 (TA x 4) Medicines:                Monitored Anesthesia Care Procedure:                Pre-Anesthesia Assessment:                           - Prior to the procedure, a History and Physical                            was performed, and patient medications and                            allergies were reviewed. The patient's tolerance of                            previous anesthesia was also reviewed. The risks                            and benefits of the procedure and the sedation                            options and risks were discussed with the patient.                            All questions were answered, and informed consent                            was obtained. Prior Anticoagulants: The patient has                            taken no anticoagulant or antiplatelet agents. ASA                            Grade Assessment: III - A patient with severe  systemic disease. After reviewing the risks and                            benefits, the patient was deemed in satisfactory                            condition to undergo the procedure.                           After obtaining informed consent, the colonoscope                            was passed under direct vision. Throughout the                            procedure, the patient's blood pressure,  pulse, and                            oxygen saturations were monitored continuously. The                            CF [id number] #[id number] was introduced through the anus                            and advanced to the cecum, identified by                            appendiceal orifice and ileocecal valve. The                            colonoscopy was performed without difficulty. The                            patient tolerated the procedure well. The quality                            of the bowel preparation was good. The ileocecal                            valve, appendiceal orifice, and rectum were                            photographed. Scope In: 2:23:39 PM Scope Out: 2:40:21 PM Scope Withdrawal Time: 0 hours 13 minutes 47 seconds  Total Procedure Duration: 0 hours 16 minutes 42 seconds  Findings:                 The digital rectal exam was normal.                           A 4 mm polyp was found in the cecum. The polyp was                            sessile. The polyp was removed with a cold snare.  Resection and retrieval were complete.                           Two sessile polyps were found in the ascending                            colon. The polyps were 4 to 6 mm in size. These                            polyps were removed with a cold snare. Resection                            and retrieval were complete.                           Five sessile polyps were found in the transverse                            colon. The polyps were 3 to 7 mm in size. These                            polyps were removed with a cold snare. Resection                            and retrieval were complete.                           Multiple medium-mouthed and small-mouthed                            diverticula were found in the sigmoid colon and                            descending colon.                           Internal hemorrhoids were found during                             retroflexion. The hemorrhoids were small. Complications:            No immediate complications. Estimated Blood Loss:     Estimated blood loss was minimal. Impression:               - One 4 mm polyp in the cecum, removed with a cold                            snare. Resected and retrieved.                           - Two 4 to 6 mm polyps in the ascending colon,                            removed with a cold snare. Resected and retrieved.                           -  Five 3 to 7 mm polyps in the transverse colon,                            removed with a cold snare. Resected and retrieved.                           - Moderate diverticulosis in the sigmoid colon and                            in the descending colon.                           - Small internal hemorrhoids. Recommendation:           - Patient has a contact number available for                            emergencies. The signs and symptoms of potential                            delayed complications were discussed with the                            patient. Return to normal activities tomorrow.                            Written discharge instructions were provided to the                            patient.                           - Resume previous diet.                           - Continue present medications.                           - Await pathology results.                           - Repeat colonoscopy is recommended for                            surveillance. The colonoscopy date will be                            determined after pathology results from today's                            exam become available for review. Gordy CHRISTELLA Starch, MD 12/10/2023 2:46:04 PM This report has been signed electronically.

## 2023-12-10 NOTE — Progress Notes (Signed)
 Called to room to assist during endoscopic procedure.  Patient ID and intended procedure confirmed with present staff. Received instructions for my participation in the procedure from the performing physician.

## 2023-12-11 ENCOUNTER — Telehealth: Payer: Self-pay | Admitting: *Deleted

## 2023-12-11 NOTE — Telephone Encounter (Signed)
  Follow up Call-     12/10/2023    1:29 PM  Call back number  Post procedure Call Back phone  # [phone]  Permission to leave phone message No     Patient questions:  Do you have a fever, pain , or abdominal swelling? No. Pain Score  0 *  Have you tolerated food without any problems? Yes.    Have you been able to return to your normal activities? Yes.    Do you have any questions about your discharge instructions: Diet   No. Medications  No. Follow up visit  No.  Do you have questions or concerns about your Care? No.  Actions: * If pain score is 4 or above: No action needed, pain <4.

## 2023-12-13 LAB — SURGICAL PATHOLOGY

## 2023-12-16 ENCOUNTER — Ambulatory Visit: Payer: Self-pay | Admitting: Internal Medicine

## 2023-12-20 ENCOUNTER — Other Ambulatory Visit: Payer: Self-pay

## 2023-12-20 ENCOUNTER — Encounter (HOSPITAL_BASED_OUTPATIENT_CLINIC_OR_DEPARTMENT_OTHER): Payer: Self-pay | Admitting: General Surgery

## 2023-12-23 ENCOUNTER — Encounter (HOSPITAL_BASED_OUTPATIENT_CLINIC_OR_DEPARTMENT_OTHER)
Admission: RE | Admit: 2023-12-23 | Discharge: 2023-12-23 | Disposition: A | Discharge status: Home / Self Care | Source: Ambulatory Visit | Attending: General Surgery | Admitting: General Surgery

## 2023-12-23 DIAGNOSIS — E118 Type 2 diabetes mellitus with unspecified complications: Secondary | ICD-10-CM | POA: Diagnosis not present

## 2023-12-23 DIAGNOSIS — Z01812 Encounter for preprocedural laboratory examination: Secondary | ICD-10-CM | POA: Diagnosis not present

## 2023-12-23 DIAGNOSIS — Z01818 Encounter for other preprocedural examination: Secondary | ICD-10-CM | POA: Diagnosis not present

## 2023-12-23 LAB — BASIC METABOLIC PANEL WITH GFR
Anion gap: 12 (ref 5–15)
BUN: 13 mg/dL (ref 8–23)
CO2: 26 mmol/L (ref 22–32)
Calcium: 9 mg/dL (ref 8.9–10.3)
Chloride: 102 mmol/L (ref 98–111)
Creatinine, Ser: 0.97 mg/dL (ref 0.61–1.24)
GFR, Estimated: >60 mL/min (ref >60)
Glucose, Bld: 104 mg/dL — ABNORMAL HIGH (ref 70–99)
Potassium: 4.1 mmol/L (ref 3.5–5.1)
Sodium: 140 mmol/L (ref 135–145)

## 2023-12-23 MED ORDER — CHLORHEXIDINE GLUCONATE CLOTH 2 % EX PADS: 6.0000 | MEDICATED_PAD | Freq: Once | CUTANEOUS | Status: DC

## 2023-12-23 NOTE — Progress Notes (Signed)

## 2023-12-26 ENCOUNTER — Ambulatory Visit (HOSPITAL_BASED_OUTPATIENT_CLINIC_OR_DEPARTMENT_OTHER)

## 2023-12-26 ENCOUNTER — Ambulatory Visit (HOSPITAL_BASED_OUTPATIENT_CLINIC_OR_DEPARTMENT_OTHER)
Admission: RE | Admit: 2023-12-26 | Discharge: 2023-12-26 | Disposition: A | Discharge status: Home / Self Care | Attending: General Surgery | Admitting: General Surgery

## 2023-12-26 ENCOUNTER — Encounter (HOSPITAL_BASED_OUTPATIENT_CLINIC_OR_DEPARTMENT_OTHER): Payer: Self-pay | Admitting: General Surgery

## 2023-12-26 ENCOUNTER — Other Ambulatory Visit: Payer: Self-pay

## 2023-12-26 ENCOUNTER — Encounter (HOSPITAL_BASED_OUTPATIENT_CLINIC_OR_DEPARTMENT_OTHER)
Admission: RE | Disposition: A | Discharge status: Home / Self Care | Payer: Self-pay | Source: Home / Self Care | Attending: General Surgery

## 2023-12-26 DIAGNOSIS — E785 Hyperlipidemia, unspecified: Secondary | ICD-10-CM

## 2023-12-26 DIAGNOSIS — M1990 Unspecified osteoarthritis, unspecified site: Secondary | ICD-10-CM | POA: Diagnosis not present

## 2023-12-26 DIAGNOSIS — C44619 Basal cell carcinoma of skin of left upper limb, including shoulder: Secondary | ICD-10-CM

## 2023-12-26 DIAGNOSIS — E119 Type 2 diabetes mellitus without complications: Secondary | ICD-10-CM | POA: Insufficient documentation

## 2023-12-26 DIAGNOSIS — Z87891 Personal history of nicotine dependence: Secondary | ICD-10-CM

## 2023-12-26 DIAGNOSIS — I10 Essential (primary) hypertension: Secondary | ICD-10-CM

## 2023-12-26 DIAGNOSIS — E1169 Type 2 diabetes mellitus with other specified complication: Secondary | ICD-10-CM

## 2023-12-26 LAB — GLUCOSE, CAPILLARY
Glucose-Capillary: 90 mg/dL (ref 70–99)
Glucose-Capillary: 96 mg/dL (ref 70–99)

## 2023-12-26 SURGERY — APPLICATION, ACELLULAR DERMAL REPLACEMENT, UPPER EXTREMITY
Anesthesia: General | Site: Arm Lower | Laterality: Left

## 2023-12-26 MED ORDER — PHENYLEPHRINE 80 MCG/ML (10ML) SYRINGE FOR IV PUSH (FOR BLOOD PRESSURE SUPPORT)
PREFILLED_SYRINGE | INTRAVENOUS | Status: DC | PRN
  Administered 2023-12-26 (×3): 80 ug via INTRAVENOUS

## 2023-12-26 MED ORDER — FENTANYL CITRATE (PF) 100 MCG/2ML IJ SOLN
INTRAMUSCULAR | Status: AC
  Filled 2023-12-26: qty 2

## 2023-12-26 MED ORDER — BUPIVACAINE HCL (PF) 0.25 % IJ SOLN
INTRAMUSCULAR | Status: DC | PRN
  Administered 2023-12-26: 5 mL

## 2023-12-26 MED ORDER — AMISULPRIDE (ANTIEMETIC) 5 MG/2ML IV SOLN: 10.0000 mg | Freq: Once | INTRAVENOUS | Status: DC | PRN

## 2023-12-26 MED ORDER — CEFAZOLIN SODIUM-DEXTROSE 2-4 GM/100ML-% IV SOLN
INTRAVENOUS | Status: AC
  Filled 2023-12-26: qty 100

## 2023-12-26 MED ORDER — ACETAMINOPHEN 500 MG PO TABS
1000.0000 mg | ORAL_TABLET | ORAL | Status: AC
  Administered 2023-12-26: 1000 mg via ORAL

## 2023-12-26 MED ORDER — 0.9 % SODIUM CHLORIDE (POUR BTL) OPTIME
TOPICAL | Status: DC | PRN
  Administered 2023-12-26: 1000 mL

## 2023-12-26 MED ORDER — FENTANYL CITRATE (PF) 100 MCG/2ML IJ SOLN
INTRAMUSCULAR | Status: DC | PRN
  Administered 2023-12-26 (×2): 25 ug via INTRAVENOUS

## 2023-12-26 MED ORDER — LIDOCAINE 2% (20 MG/ML) 5 ML SYRINGE
INTRAMUSCULAR | Status: DC | PRN
  Administered 2023-12-26: 60 mg via INTRAVENOUS

## 2023-12-26 MED ORDER — ACETAMINOPHEN 500 MG PO TABS
ORAL_TABLET | ORAL | Status: AC
  Filled 2023-12-26: qty 2

## 2023-12-26 MED ORDER — LIDOCAINE-EPINEPHRINE (PF) 1 %-1:200000 IJ SOLN
INTRAMUSCULAR | Status: DC | PRN
  Administered 2023-12-26: 5 mL

## 2023-12-26 MED ORDER — OXYCODONE HCL 5 MG PO TABS: 5.0000 mg | ORAL_TABLET | Freq: Once | ORAL | Status: DC | PRN

## 2023-12-26 MED ORDER — CEFAZOLIN SODIUM-DEXTROSE 2-4 GM/100ML-% IV SOLN
2.0000 g | INTRAVENOUS | Status: AC
  Administered 2023-12-26: 2 g via INTRAVENOUS

## 2023-12-26 MED ORDER — EPHEDRINE 5 MG/ML INJ
INTRAVENOUS | Status: AC
  Filled 2023-12-26: qty 5

## 2023-12-26 MED ORDER — ONDANSETRON HCL 4 MG/2ML IJ SOLN
INTRAMUSCULAR | Status: DC | PRN
  Administered 2023-12-26: 4 mg via INTRAVENOUS

## 2023-12-26 MED ORDER — TRAMADOL HCL 50 MG PO TABS: 50.0000 mg | ORAL_TABLET | Freq: Four times a day (QID) | ORAL | 0 refills | Status: DC | PRN

## 2023-12-26 MED ORDER — HYDROMORPHONE HCL 1 MG/ML IJ SOLN: 0.2500 mg | INTRAMUSCULAR | Status: DC | PRN

## 2023-12-26 MED ORDER — PROPOFOL 10 MG/ML IV BOLUS
INTRAVENOUS | Status: DC | PRN
  Administered 2023-12-26: 160 mg via INTRAVENOUS
  Administered 2023-12-26: 200 mg via INTRAVENOUS

## 2023-12-26 MED ORDER — ONDANSETRON HCL 4 MG/2ML IJ SOLN
INTRAMUSCULAR | Status: AC
  Filled 2023-12-26: qty 2

## 2023-12-26 MED ORDER — OXYCODONE HCL 5 MG/5ML PO SOLN: 5.0000 mg | Freq: Once | ORAL | Status: DC | PRN

## 2023-12-26 MED ORDER — LACTATED RINGERS IV SOLN
INTRAVENOUS | Status: DC
Start: 2023-12-26 — End: 2023-12-26

## 2023-12-26 SURGICAL SUPPLY — 44 items
BLADE HEX COATED 2.75 (ELECTRODE) ×1 IMPLANT
BLADE SURG 10 STRL SS (BLADE) ×3 IMPLANT
BNDG COHESIVE 4X5 TAN STRL LF (GAUZE/BANDAGES/DRESSINGS) ×1 IMPLANT
BNDG GAUZE DERMACEA FLUFF 4 (GAUZE/BANDAGES/DRESSINGS) ×1 IMPLANT
CANISTER SUCT 1200ML W/VALVE (MISCELLANEOUS) ×1 IMPLANT
CHLORAPREP W/TINT 26 (MISCELLANEOUS) ×1 IMPLANT
COVER BACK TABLE 60X90IN (DRAPES) ×1 IMPLANT
COVER MAYO STAND STRL (DRAPES) ×1 IMPLANT
DERMABOND ADVANCED .7 DNX12 (GAUZE/BANDAGES/DRESSINGS) ×1 IMPLANT
DRAPE EXTREMITY T 121X128X90 (DISPOSABLE) ×1 IMPLANT
DRAPE LAPAROSCOPIC ABDOMINAL (DRAPES) IMPLANT
DRAPE UTILITY XL STRL (DRAPES) ×1 IMPLANT
DRSG CUTIMED SORBACT 7X9 (GAUZE/BANDAGES/DRESSINGS) ×1 IMPLANT
DRSG TELFA 3X8 NADH STRL (GAUZE/BANDAGES/DRESSINGS) ×1 IMPLANT
ELECTRODE REM PT RTRN 9FT ADLT (ELECTROSURGICAL) ×2 IMPLANT
GAUZE PAD ABD 8X10 STRL (GAUZE/BANDAGES/DRESSINGS) IMPLANT
GAUZE SPONGE 4X4 12PLY STRL LF (GAUZE/BANDAGES/DRESSINGS) ×2 IMPLANT
GLOVE BIO SURGEON STRL SZ 6 (GLOVE) ×2 IMPLANT
GLOVE BIOGEL PI IND STRL 6.5 (GLOVE) ×2 IMPLANT
GOWN STRL REUS W/ TWL LRG LVL3 (GOWN DISPOSABLE) ×2 IMPLANT
GOWN STRL REUS W/ TWL XL LVL3 (GOWN DISPOSABLE) ×3 IMPLANT
GRAFT MYRIAD 3 LAYER 7X10 (Graft) ×1 IMPLANT
NDL HYPO 25X1 1.5 SAFETY (NEEDLE) ×1 IMPLANT
NEEDLE HYPO 25X1 1.5 SAFETY (NEEDLE) ×2 IMPLANT
NS IRRIG 1000ML POUR BTL (IV SOLUTION) ×1 IMPLANT
PACK BASIN DAY SURGERY FS (CUSTOM PROCEDURE TRAY) ×2 IMPLANT
PACK UNIVERSAL I (CUSTOM PROCEDURE TRAY) IMPLANT
PENCIL SMOKE EVACUATOR (MISCELLANEOUS) ×2 IMPLANT
POWDER MYRIAD MORCLLS FINE 500 (Miscellaneous) ×1 IMPLANT
SLEEVE SCD COMPRESS KNEE MED (STOCKING) ×1 IMPLANT
SPIKE FLUID TRANSFER (MISCELLANEOUS) IMPLANT
SPONGE T-LAP 18X18 ~~LOC~~+RFID (SPONGE) ×2 IMPLANT
STAPLER SKIN PROX WIDE 3.9 (STAPLE) IMPLANT
STRIP CLOSURE SKIN 1/2X4 (GAUZE/BANDAGES/DRESSINGS) ×1 IMPLANT
SUT MNCRL AB 4-0 PS2 18 (SUTURE) ×1 IMPLANT
SUT PROLENE 3 0 PS 2 (SUTURE) ×4 IMPLANT
SUT SILK 3-0 18XBRD TIE BLK (SUTURE) IMPLANT
SUT VIC AB 2-0 SH 27XBRD (SUTURE) IMPLANT
SUT VICRYL 3-0 CR8 SH (SUTURE) ×2 IMPLANT
SYR BULB EAR ULCER 3OZ GRN STR (SYRINGE) ×2 IMPLANT
SYR CONTROL 10ML LL (SYRINGE) ×2 IMPLANT
TOWEL GREEN STERILE FF (TOWEL DISPOSABLE) ×2 IMPLANT
TUBE CONNECTING 20X1/4 (TUBING) ×1 IMPLANT
YANKAUER SUCT BULB TIP NO VENT (SUCTIONS) ×1 IMPLANT

## 2023-12-26 NOTE — H&P (Addendum)
 REFERRING PHYSICIAN:  Myra Hollering PROVIDER:  JINA CLAIR NEPHEW, MD MRN: [id number] DOB: 10-Apr-1950 DATE OF ENCOUNTER: 12/02/2023 Subjective    Plan Chief Complaint: New Consultation (L forearm basal cell carcinoma)   History of Present Illness: Ricardo Hahn is a 73 y.o. male who is seen today as an office consultation for evaluation of New Consultation (L forearm basal cell carcinoma)     History of Present Illness Ricardo Hahn is a 73 year old male with a history of basal and squamous cell carcinoma who presents with a bleeding lesion on his arm. He is accompanied by his wife.   He has had a lesion on his arm for approximately six months. It initially appeared as a scaly patch and has since grown and started bleeding, especially after being caught by his dog's  claw. The bleeding increased significantly following a biopsy performed by a dermatologist.   He has a history of basal and squamous cell carcinoma, with previous treatments including cryotherapy for other patches and surgical removal by Mohs surgery on his back. He applies Vaseline to the lesion before covering it to prevent sticking and bleeding.   He is currently taking medication for hypertension and diabetes. He was previously on Farxiga  but switched to Brenzavvy  due to cost, resulting in a weight loss of about twenty pounds over the past year.   No history of myocardial infarction, cerebrovascular accident, or heart failure. He experiences no significant dyspnea or edema.   He enjoys hunting, particularly deer, and has a friend who allows him to hunt on his farm. He is originally from [place] .       Review of Systems: A complete review of systems was obtained from the patient.  I have reviewed this information and discussed as appropriate with the patient.  See HPI as well for other ROS.   Review of Systems  HENT:  Positive for hearing loss and tinnitus.   Genitourinary:        Difficulty urinating  All  other systems reviewed and are negative.     Medical History: Past Medical History      Past Medical History:  Diagnosis Date   Arthritis     Diabetes mellitus without complication (CMS/HHS-HCC)     History of cancer     Hypertension          Problem List     Patient Active Problem List  Diagnosis   Squamous cell cancer of skin of left forearm        Past Surgical History       Past Surgical History:  Procedure Laterality Date   HERNIA REPAIR            Allergies  No Known Allergies     Medications Ordered Prior to Encounter        Current Outpatient Medications on File Prior to Visit  Medication Sig Dispense Refill   aspirin 81 mg tablet         bexagliflozin  20 mg Tab Take 20 mg by mouth every morning       carvediloL  (COREG ) 6.25 MG tablet Take 6.25 mg by mouth 2 (two) times daily with meals       indapamide  (LOZOL ) 1.25 MG tablet Take 1.25 mg by mouth once daily       rosuvastatin  (CRESTOR ) 20 MG tablet Take 20 mg by mouth once daily       valsartan  (DIOVAN ) 320 MG tablet Take 320 mg by mouth once daily  No current facility-administered medications on file prior to visit.        Family History       Family History  Problem Relation Age of Onset   Stroke Mother     Hyperlipidemia (Elevated cholesterol) Mother     High blood pressure (Hypertension) Mother     Coronary Artery Disease (Blocked arteries around heart) Mother     Diabetes Mother     High blood pressure (Hypertension) Father     Hyperlipidemia (Elevated cholesterol) Father     Diabetes Father     Colon cancer Father     Colon cancer Brother          Tobacco Use History  Social History        Tobacco Use  Smoking Status Former   Types: Cigarettes  Smokeless Tobacco Never        Social History  Social History         Socioeconomic History   Marital status: Married  Tobacco Use   Smoking status: Former      Types: Cigarettes   Smokeless tobacco: Never  Substance  and Sexual Activity   Drug use: Never    Social Drivers of Acupuncturist Strain: Medium Risk (09/30/2023)    Received from [facility]    Overall Financial Resource Strain (CARDIA)     How hard is it for you to pay for the very basics like food, housing, medical care, and heating?: Somewhat hard  Food Insecurity: Patient Declined (09/30/2023)    Received from [facility]    Hunger Vital Sign     Within the past 12 months, you worried that your food would run out before you got the money to buy more.: Patient declined     Within the past 12 months, the food you bought just didn't last and you didn't have money to get more.: Patient declined  Transportation Needs: No Transportation Needs (09/30/2023)    Received from [facility] - [facility]     In the past 12 months, has lack of transportation kept you from medical appointments or from getting medications?: No     In the past 12 months, has lack of transportation kept you from meetings, work, or from getting things needed for daily living?: No  Physical Activity: Insufficiently Active (09/30/2023)    Received from [facility]    Exercise Vital Sign     On average, how many days per week do you engage in moderate to strenuous exercise (like a brisk walk)?: 2 days     On average, how many minutes do you engage in exercise at this level?: 20 min  Stress: No Stress Concern Present (09/30/2023)    Received from [facility] - Occupational Stress Questionnaire     Do you feel stress - tense, restless, nervous, or anxious, or unable to sleep at night because your mind is troubled all the time - these days?: Not at all  Social Connections: Moderately Integrated (09/30/2023)    Received from [facility]    Social Connection and Isolation Panel     In a typical week, how many times do you talk on the phone with family, friends, or neighbors?: More than three times a week      How often do you get together with friends or relatives?: Twice a week     How often  do you attend church or religious services?: More than 4 times per year     Do you belong to any clubs or organizations such as church groups, unions, fraternal or athletic groups, or school groups?: No     Are you married, widowed, divorced, separated, never married, or living with a partner?: Married  Housing Stability: Unknown (12/02/2023)    Housing Stability Vital Sign     Homeless in the Last Year: No        Objective:       Vitals:    12/02/23 1038  BP: 136/75  Pulse: 81  Temp: 36.7 C (98 F)  SpO2: 97%  Weight: 91.2 kg (201 lb)  Height: 172.7 cm (5' 8)    Body mass index is 30.56 kg/m.   Head:   Normocephalic and atraumatic.  Eyes:    Conjunctivae are normal. Pupils are equal, round, and reactive to light. No scleral icterus.  Neck:   Normal range of motion. Neck supple. No tracheal deviation present. No thyromegaly present.  Resp:   No respiratory distress, normal effort. Neurological: Alert and oriented to person, place, and time. Coordination normal.  Skin:    Skin is warm and dry. No rash noted. No diaphoretic. No erythema. No pallor. Sun damage throughout.  Left forearm with 3 cm raised violaceous lesion that is friable.   Psychiatric: Normal mood and affect. Normal behavior. Judgment and thought content normal.      Labs, Imaging and Diagnostic Testing: Not available.    Assessment and Plan:    Assessment Diagnoses and all orders for this visit:   Squamous cell cancer of skin of left forearm     Assessment & Plan Chronic skin lesion of the forearm Lesion likely basal cell carcinoma, requires wide local excision with advancement flap closure due to size and location. - Schedule wide local excision with advancement flap closure and skin substitute placement.  I reviewed that the lesion will be too tight to be able to pull together all the way and that the center portion  will require skin substitute or a skin graft.  Due to the issue of having a second wound with the skin graft, we will use skin substitute. - Ensure general anesthesia for procedure. - Provide post-operative care instructions: keep wound moist with Surgilube, use non-stick dressings. - Advise against heavy lifting and hunting for several weeks post-surgery. - Plan follow-up visits one week post-surgery, then weekly until satisfactory healing.     Discussed the surgery which primarily involves wound healing complications and infection.  There is a high risk of prolonged wound healing.  Discussed that he will need to keep the wound moist and clean.

## 2023-12-26 NOTE — Discharge Instructions (Signed)
  Post Anesthesia Home Care Instructions  Activity: Get plenty of rest for the remainder of the day. A responsible individual must stay with you for 24 hours following the procedure.  For the next 24 hours, DO NOT: -Drive a car -Advertising copywriter -Drink alcoholic beverages -Take any medication unless instructed by your physician -Make any legal decisions or sign important papers.  Meals: Start with liquid foods such as gelatin or soup. Progress to regular foods as tolerated. Avoid greasy, spicy, heavy foods. If nausea and/or vomiting occur, drink only clear liquids until the nausea and/or vomiting subsides. Call your physician if vomiting continues.  Special Instructions/Symptoms: Your throat may feel dry or sore from the anesthesia or the breathing tube placed in your throat during surgery. If this causes discomfort, gargle with warm salt water. The discomfort should disappear within 24 hours.  If you had a scopolamine patch placed behind your ear for the management of post- operative nausea and/or vomiting:  1. The medication in the patch is effective for 72 hours, after which it should be removed.  Wrap patch in a tissue and discard in the trash. Wash hands thoroughly with soap and water. 2. You may remove the patch earlier than 72 hours if you experience unpleasant side effects which may include dry mouth, dizziness or visual disturbances. 3. Avoid touching the patch. Wash your hands with soap and water after contact with the patch.     No tylenol until after 7pm

## 2023-12-26 NOTE — Op Note (Signed)
 PRE-OPERATIVE DIAGNOSIS: left forearm basal cell carcinoma  POST-OPERATIVE DIAGNOSIS:  Same  PROCEDURE:  Procedure(s): Wide local excision left distal forearm basal cell carcinoma, skin substitute coverage for defect 5.1 x 6 cm.  SURGEON:  Surgeon(s): [name], MD  ASSIST:  Larnell Costain, PGY6  ANESTHESIA:   local and general  DRAINS: none   LOCAL MEDICATIONS USED:  MARCAINE    and XYLOCAINE    SPECIMEN:  Source of Specimen:  wide local excision left forearm basal cell carcinoma  FINDINGS:  no gross residual disease.    DISPOSITION OF SPECIMEN:  PATHOLOGY  COUNTS:  YES  PLAN OF CARE: Discharge to home after PACU  PATIENT DISPOSITION:  PACU - hemodynamically stable.    PROCEDURE:   Pt was identified in the holding area, taken to the OR, and placed supine on the OR table.  General anesthesia was induced.  Time out was performed according to the surgical safety checklist.  When all was correct, we continued.  The left forearm/hand were prepped and draped in sterile fashion.  The lesion was identified and 0.5-1 cm margins were marked out.  Local was administered under the melanoma and the adjacent tissue.  A #10 blade was used to incise the skin around the melanoma.  The cautery was used to take the dissection down to the fascia.  The skin was marked in situ with orientation sutures.  The cautery was used to take the specimen off the fascia, and it was passed off the table.    The adjacent skin was examined and it did not seem that much could be mobilized and pulled together given the size of the mass and the location near the wrist.  There was not much redundant tissue.  The wound was measured and the 7x10 cm myriad matrix was selected.  This was trimmed to the appropriate size.  It was secured to the skin border with 3-0 vicryl interrupted sutures.  Before this was completely closed, 500 mg myriad morcells were placed underneath the skin substitute to promote healing.  The border  was then completely closed.    Over the top of this, Sorbact mesh was placed and secured with interrupted 3-0 prolenes. The wound was then cleaned, dried, and dressed with surgilube, telfa, gauze, kerlix, and coban.  Needle, sponge, and instrument counts were correct.  The patient was awakened from anesthesia and taken to the PACU in stable condition.

## 2023-12-26 NOTE — Anesthesia Procedure Notes (Signed)
 Procedure Name: LMA Insertion Date/Time: 12/26/2023 1:55 PM  Performed by: Delayne Olam BIRCH, CRNAPre-anesthesia Checklist: Patient identified, Emergency Drugs available, Suction available and Patient being monitored Patient Re-evaluated:Patient Re-evaluated prior to induction Oxygen Delivery Method: Circle system utilized Preoxygenation: Pre-oxygenation with 100% oxygen Induction Type: IV induction Ventilation: Mask ventilation without difficulty LMA: LMA inserted LMA Size: 5.0 Number of attempts: 1 Airway Equipment and Method: Bite block Placement Confirmation: positive ETCO2 Tube secured with: Tape Dental Injury: Teeth and Oropharynx as per pre-operative assessment

## 2023-12-26 NOTE — Transfer of Care (Signed)
 Immediate Anesthesia Transfer of Care Note  Patient: Ricardo Hahn  Procedure(s) Performed: Procedure(s) (LRB): APPLICATION SKIN SUBSTITUTE (Left) WIDE EXCISION BASAL CELL CARCINOMA LEFT FOREARM (Left)  Patient Location: PACU  Anesthesia Type: General  Level of Consciousness: awake, oriented, sedated and patient cooperative  Airway & Oxygen Therapy: Patient Spontanous Breathing and Patient connected to nasal cannula  Post-op Assessment: Report given to PACU RN and Post -op Vital signs reviewed and stable  Post vital signs: Reviewed and stable  Complications: No apparent anesthesia complications  Last Vitals:  Vitals Value Taken Time  BP 135/51 12/26/23 14:54  Temp    Pulse 80 12/26/23 14:56  Resp 19 12/26/23 14:56  SpO2 97 % 12/26/23 14:56  Vitals shown include unfiled device data.  Last Pain:  Vitals:   12/26/23 1248  TempSrc: Temporal  PainSc: 0-No pain      Patients Stated Pain Goal: 4 (12/26/23 1248)  Complications: No notable events documented.

## 2023-12-26 NOTE — Anesthesia Preprocedure Evaluation (Signed)
 Anesthesia Evaluation  Patient identified by MRN, date of birth, ID band Patient awake    Reviewed: Allergy & Precautions, H&P , NPO status , Patient's Chart, lab work & pertinent test results  Airway Mallampati: II  TM Distance: >3 FB Neck ROM: Full    Dental  (+) Dental Advisory Given   Pulmonary neg pulmonary ROS, former smoker   Pulmonary exam normal breath sounds clear to auscultation       Cardiovascular hypertension, Pt. on medications negative cardio ROS Normal cardiovascular exam Rhythm:Regular Rate:Normal     Neuro/Psych negative neurological ROS  negative psych ROS   GI/Hepatic negative GI ROS, Neg liver ROS,,,  Endo/Other  negative endocrine ROSdiabetes, Type 2    Renal/GU negative Renal ROS  negative genitourinary   Musculoskeletal  (+) Arthritis , Osteoarthritis,    Abdominal  (+) + obese  Peds negative pediatric ROS (+)  Hematology negative hematology ROS (+)   Anesthesia Other Findings   Reproductive/Obstetrics negative OB ROS                              Anesthesia Physical Anesthesia Plan  ASA: 3  Anesthesia Plan: General   Post-op Pain Management:    Induction: Intravenous  PONV Risk Score and Plan: 2 and Ondansetron, Midazolam and Treatment may vary due to age or medical condition  Airway Management Planned: LMA  Additional Equipment:   Intra-op Plan:   Post-operative Plan: Extubation in OR  Informed Consent: I have reviewed the patients History and Physical, chart, labs and discussed the procedure including the risks, benefits and alternatives for the proposed anesthesia with the patient or authorized representative who has indicated his/her understanding and acceptance.     Dental advisory given  Plan Discussed with: CRNA  Anesthesia Plan Comments:         Anesthesia Quick Evaluation

## 2023-12-26 NOTE — Interval H&P Note (Signed)
 History and Physical Interval Note:  12/26/2023 12:50 PM  Ricardo Hahn  has presented today for surgery, with the diagnosis of LEFT FOREARM BASAL CELL CANCER.  The various methods of treatment have been discussed with the patient and family. After consideration of risks, benefits and other options for treatment, the patient has consented to  Procedure(s) with comments:  - WIDE LOCAL EXCISION LEFT FOREARM BASAL CELL CARCINOMA, WITH ADVANCEMENT FLAP CLOSURE AND SKIN SUBSTITUTE COVERAGE as a surgical intervention.  The patient's history has been reviewed, patient examined, no change in status, stable for surgery.  I have reviewed the patient's chart and labs.  Questions were answered to the patient's satisfaction.     [name]

## 2023-12-27 ENCOUNTER — Encounter (HOSPITAL_BASED_OUTPATIENT_CLINIC_OR_DEPARTMENT_OTHER): Payer: Self-pay | Admitting: General Surgery

## 2023-12-31 ENCOUNTER — Ambulatory Visit: Payer: Self-pay | Admitting: General Surgery

## 2023-12-31 LAB — SURGICAL PATHOLOGY

## 2024-01-06 NOTE — Anesthesia Postprocedure Evaluation (Signed)
 Anesthesia Post Note  Patient: Ricardo Hahn  Procedure(s) Performed: APPLICATION SKIN SUBSTITUTE (Left: Arm Lower) WIDE EXCISION BASAL CELL CARCINOMA LEFT FOREARM (Left: Arm Lower)     Patient location during evaluation: PACU Anesthesia Type: General Level of consciousness: awake and alert Pain management: pain level controlled Vital Signs Assessment: post-procedure vital signs reviewed and stable Respiratory status: spontaneous breathing, nonlabored ventilation and respiratory function stable Cardiovascular status: blood pressure returned to baseline and stable Postop Assessment: no apparent nausea or vomiting Anesthetic complications: no   No notable events documented.  Last Vitals:  Vitals:   12/26/23 1515 12/26/23 1532  BP: (!) 147/63 (!) 148/63  Pulse: 70 77  Resp: 14 16  Temp:  (!) 36.4 C  SpO2: 93% 94%    Last Pain:  Vitals:   12/30/23 0929  TempSrc:   PainSc: 0-No pain                 Butler Levander Pinal

## 2024-01-15 ENCOUNTER — Other Ambulatory Visit: Payer: Self-pay | Admitting: Internal Medicine

## 2024-01-15 DIAGNOSIS — E785 Hyperlipidemia, unspecified: Secondary | ICD-10-CM

## 2024-01-17 DIAGNOSIS — C44619 Basal cell carcinoma of skin of left upper limb, including shoulder: Secondary | ICD-10-CM | POA: Diagnosis not present

## 2024-02-03 DIAGNOSIS — C44619 Basal cell carcinoma of skin of left upper limb, including shoulder: Secondary | ICD-10-CM | POA: Diagnosis not present

## 2024-02-24 DIAGNOSIS — C44619 Basal cell carcinoma of skin of left upper limb, including shoulder: Secondary | ICD-10-CM | POA: Diagnosis not present

## 2024-03-06 ENCOUNTER — Encounter: Payer: Self-pay | Admitting: *Deleted

## 2024-03-06 NOTE — Progress Notes (Signed)
 Ricardo Hahn                                          MRN: [id number]   03/06/2024   The [facility] Quality Team Specialist reviewed this patient medical record for the purposes of chart review for care gap closure. The following were reviewed: abstraction for care gap closure-controlling blood pressure.    [facility] Quality Team

## 2024-03-18 NOTE — Progress Notes (Signed)
 Ricardo Hahn                                          MRN: [id number]   03/18/2024   The [facility] Quality Team Specialist reviewed this patient medical record for the purposes of chart review for care gap closure. The following were reviewed: chart review for care gap closure-controlling blood pressure.    [facility] Quality Team

## 2024-03-26 ENCOUNTER — Encounter: Payer: Self-pay | Admitting: Internal Medicine

## 2024-03-26 ENCOUNTER — Other Ambulatory Visit: Payer: Self-pay | Admitting: Internal Medicine

## 2024-03-26 DIAGNOSIS — E1169 Type 2 diabetes mellitus with other specified complication: Secondary | ICD-10-CM

## 2024-04-08 ENCOUNTER — Encounter: Payer: Self-pay | Admitting: Internal Medicine

## 2024-04-08 ENCOUNTER — Ambulatory Visit: Admitting: Internal Medicine

## 2024-04-08 ENCOUNTER — Ambulatory Visit: Payer: Self-pay | Admitting: Internal Medicine

## 2024-04-08 VITALS — BP 138/84 | HR 67 | Temp 98.3°F | Resp 16 | Ht 68.0 in | Wt 197.0 lb

## 2024-04-08 DIAGNOSIS — I10 Essential (primary) hypertension: Secondary | ICD-10-CM

## 2024-04-08 DIAGNOSIS — E1169 Type 2 diabetes mellitus with other specified complication: Secondary | ICD-10-CM

## 2024-04-08 DIAGNOSIS — I491 Atrial premature depolarization: Secondary | ICD-10-CM

## 2024-04-08 DIAGNOSIS — E1159 Type 2 diabetes mellitus with other circulatory complications: Secondary | ICD-10-CM

## 2024-04-08 DIAGNOSIS — E118 Type 2 diabetes mellitus with unspecified complications: Secondary | ICD-10-CM

## 2024-04-08 DIAGNOSIS — I493 Ventricular premature depolarization: Secondary | ICD-10-CM

## 2024-04-08 DIAGNOSIS — E785 Hyperlipidemia, unspecified: Secondary | ICD-10-CM

## 2024-04-08 DIAGNOSIS — E119 Type 2 diabetes mellitus without complications: Secondary | ICD-10-CM | POA: Insufficient documentation

## 2024-04-08 LAB — BASIC METABOLIC PANEL WITH GFR
BUN: 17 mg/dL (ref 6–23)
CO2: 28 meq/L (ref 19–32)
Calcium: 9.5 mg/dL (ref 8.4–10.5)
Chloride: 100 meq/L (ref 96–112)
Creatinine, Ser: 0.82 mg/dL (ref 0.40–1.50)
GFR: 86.91 mL/min
Glucose, Bld: 100 mg/dL — ABNORMAL HIGH (ref 70–99)
Potassium: 4 meq/L (ref 3.5–5.1)
Sodium: 137 meq/L (ref 135–145)

## 2024-04-08 LAB — URINALYSIS, ROUTINE W REFLEX MICROSCOPIC
Bilirubin Urine: NEGATIVE
Hgb urine dipstick: NEGATIVE
Ketones, ur: NEGATIVE
Leukocytes,Ua: NEGATIVE
Nitrite: NEGATIVE
Specific Gravity, Urine: 1.015 (ref 1.000–1.030)
Total Protein, Urine: NEGATIVE
Urine Glucose: >=1000 — AB
Urobilinogen, UA: 0.2 (ref 0.0–1.0)
pH: 5.5 (ref 5.0–8.0)

## 2024-04-08 LAB — VITAMIN B12: Vitamin B-12: 418 pg/mL (ref 211–911)

## 2024-04-08 LAB — HEPATIC FUNCTION PANEL
ALT: 22 U/L (ref 3–53)
AST: 24 U/L (ref 5–37)
Albumin: 4.2 g/dL (ref 3.5–5.2)
Alkaline Phosphatase: 38 U/L — ABNORMAL LOW (ref 39–117)
Bilirubin, Direct: 0.1 mg/dL (ref 0.1–0.3)
Total Bilirubin: 0.4 mg/dL (ref 0.2–1.2)
Total Protein: 7.4 g/dL (ref 6.0–8.3)

## 2024-04-08 LAB — CBC WITH DIFFERENTIAL/PLATELET
Basophils Absolute: 0.1 10*3/uL (ref 0.0–0.1)
Basophils Relative: 1.1 % (ref 0.0–3.0)
Eosinophils Absolute: 0.2 10*3/uL (ref 0.0–0.7)
Eosinophils Relative: 3.3 % (ref 0.0–5.0)
HCT: 41.3 % (ref 39.0–52.0)
Hemoglobin: 14.1 g/dL (ref 13.0–17.0)
Lymphocytes Relative: 27.1 % (ref 12.0–46.0)
Lymphs Abs: 1.7 10*3/uL (ref 0.7–4.0)
MCHC: 34.1 g/dL (ref 30.0–36.0)
MCV: 89.2 fl (ref 78.0–100.0)
Monocytes Absolute: 0.7 10*3/uL (ref 0.1–1.0)
Monocytes Relative: 10.4 % (ref 3.0–12.0)
Neutro Abs: 3.7 10*3/uL (ref 1.4–7.7)
Neutrophils Relative %: 58.1 % (ref 43.0–77.0)
Platelets: 205 10*3/uL (ref 150.0–400.0)
RBC: 4.63 Mil/uL (ref 4.22–5.81)
RDW: 15.1 % (ref 11.5–15.5)
WBC: 6.4 10*3/uL (ref 4.0–10.5)

## 2024-04-08 LAB — MICROALBUMIN / CREATININE URINE RATIO
Creatinine,U: 48.7 mg/dL
Microalb Creat Ratio: UNDETERMINED mg/g (ref 0.0–30.0)
Microalb, Ur: <0.7 mg/dL

## 2024-04-08 LAB — HEMOGLOBIN A1C: Hgb A1c MFr Bld: 6.4 % (ref 4.6–6.5)

## 2024-04-08 LAB — TSH: TSH: 2.87 u[IU]/mL (ref 0.35–5.50)

## 2024-04-08 MED ORDER — COVID-19 MRNA VAC-TRIS(PFIZER) 30 MCG/0.3ML IM SUSY: 0.3000 mL | PREFILLED_SYRINGE | Freq: Once | INTRAMUSCULAR | 0 refills | Status: DC

## 2024-04-08 MED ORDER — BEXAGLIFLOZIN 20 MG PO TABS: 1.0000 | ORAL_TABLET | Freq: Every morning | ORAL | 1 refills | Status: AC

## 2024-04-08 NOTE — Progress Notes (Signed)
 "  Subjective:  Patient ID: Ricardo Hahn, male    DOB: 15-Jun-1950  Age: 73 y.o. MRN: [id number]  CC: Hypertension and Diabetes   HPI Ricardo Hahn presents for f/up ----  Discussed the use of AI scribe software for clinical note transcription with the patient, who gave verbal consent to proceed.  History of Present Illness Ricardo Hahn is a 74 year old male who presents for a follow-up visit after a recent skin cancer operation.  He underwent a skin cancer operation on his arm, where a lesion the size of two overlapping quarters was excised down to the muscle. Post-operatively, a material derived from sheep cells was used to encourage skin regeneration.  He feels good and remains active, including shoveling snow without experiencing any chest pain, shortness of breath, dizziness, or lightheadedness. He mentions pacing himself while shoveling by taking breaks.  He has not experienced any side effects from his medications and denies symptoms of high or low blood sugar, such as dizziness, excessive thirst, or excessive urination.  He missed his usual September eye exam due to the surgery but has rescheduled it.     Outpatient Medications Prior to Visit  Medication Sig Dispense Refill   acetaminophen  (TYLENOL ) 500 MG tablet Take 500 mg by mouth every 6 (six) hours as needed.     aspirin EC 81 MG tablet Take 81 mg by mouth daily. Swallow whole.     carvedilol  (COREG ) 6.25 MG tablet Take 1 tablet (6.25 mg total) by mouth 2 (two) times daily with a meal. 180 tablet 1   indapamide  (LOZOL ) 1.25 MG tablet TAKE 1 TABLET BY MOUTH DAILY. 90 tablet 1   ketoconazole  (NIZORAL ) 2 % cream Apply 1 Application topically 2 (two) times daily. 60 g 2   Multiple Vitamin (MULTIVITAMIN) tablet Take 1 tablet by mouth daily.     rosuvastatin  (CRESTOR ) 20 MG tablet TAKE 1 TABLET BY MOUTH EVERY DAY 90 tablet 1   valsartan  (DIOVAN ) 320 MG tablet Take 1 tablet (320 mg total) by mouth daily. 90 tablet 1    Bexagliflozin  (BRENZAVVY ) 20 MG TABS TAKE 1 TABLET BY MOUTH EVERY MORNING 60 tablet 0   Na Sulfate-K Sulfate-Mg Sulfate concentrate (SUPREP) 17.5-3.13-1.6 GM/177ML SOLN TAKE 1 KIT (354 MLS TOTAL) BY MOUTH ONCE FOR 1 DOSE. MAY USE GENERIC SUPREP USE GOOD RX COUPON     traMADol  (ULTRAM ) 50 MG tablet Take 1 tablet (50 mg total) by mouth every 6 (six) hours as needed for moderate pain (pain score 4-6) or severe pain (pain score 7-10). 20 tablet 0   No facility-administered medications prior to visit.    ROS Review of Systems  Constitutional:  Negative for appetite change, chills, diaphoresis, fatigue and fever.  HENT: Negative.    Eyes: Negative.  Negative for discharge.  Respiratory:  Negative for cough, chest tightness, shortness of breath and stridor.   Cardiovascular:  Negative for chest pain, palpitations and leg swelling.  Gastrointestinal: Negative.  Negative for abdominal pain, constipation, diarrhea, nausea and vomiting.  Endocrine: Negative.   Genitourinary: Negative.  Negative for difficulty urinating and dysuria.  Musculoskeletal:  Negative for arthralgias, gait problem and neck stiffness.  Skin: Negative.  Negative for color change and pallor.  Neurological: Negative.  Negative for dizziness.  Hematological:  Negative for adenopathy. Does not bruise/bleed easily.  Psychiatric/Behavioral: Negative.      Objective:  BP 138/84 (BP Location: Right Arm, Patient Position: Sitting, Cuff Size: Normal)   Pulse 67   Temp 98.3 F (36.8  C) (Oral)   Resp 16   Ht 5' 8 (1.727 m)   Wt 197 lb (89.4 kg)   SpO2 95%   BMI 29.95 kg/m   BP Readings from Last 3 Encounters:  04/08/24 138/84  12/26/23 (!) 148/63  12/10/23 (!) 140/40    Wt Readings from Last 3 Encounters:  04/08/24 197 lb (89.4 kg)  12/26/23 197 lb 15.6 oz (89.8 kg)  12/10/23 195 lb (88.5 kg)    Physical Exam Vitals reviewed.  Constitutional:      Appearance: Normal appearance.  HENT:     Mouth/Throat:      Mouth: Mucous membranes are moist.  Eyes:     General: No scleral icterus.    Conjunctiva/sclera: Conjunctivae normal.  Cardiovascular:     Rate and Rhythm: Normal rate and regular rhythm.     Heart sounds: No murmur heard.    No friction rub. No gallop.     Comments: EKG--- NSR, 70 bpm No LVH, Q waves, or ST/T wave changes  Pulmonary:     Effort: Pulmonary effort is normal.     Breath sounds: No stridor. No wheezing, rhonchi or rales.  Abdominal:     General: Abdomen is protuberant. Bowel sounds are normal. There is no distension.     Palpations: Abdomen is soft. There is no hepatomegaly, splenomegaly or mass.     Tenderness: There is no abdominal tenderness. There is no guarding or rebound.  Musculoskeletal:        General: Normal range of motion.     Cervical back: Neck supple.     Right lower leg: No edema.     Left lower leg: No edema.  Lymphadenopathy:     Cervical: No cervical adenopathy.  Skin:    General: Skin is warm and dry.     Coloration: Skin is not jaundiced.  Neurological:     General: No focal deficit present.     Mental Status: He is alert.  Psychiatric:        Mood and Affect: Mood normal.        Behavior: Behavior normal.     Lab Results  Component Value Date   WBC 6.4 04/08/2024   HGB 14.1 04/08/2024   HCT 41.3 04/08/2024   PLT 205.0 04/08/2024   GLUCOSE 100 (H) 04/08/2024   CHOL 156 10/02/2023   TRIG 116.0 10/02/2023   HDL 47.20 10/02/2023   LDLDIRECT 174.0 02/06/2021   LDLCALC 85 10/02/2023   ALT 22 04/08/2024   AST 24 04/08/2024   NA 137 04/08/2024   K 4.0 04/08/2024   CL 100 04/08/2024   CREATININE 0.82 04/08/2024   BUN 17 04/08/2024   CO2 28 04/08/2024   TSH 2.87 04/08/2024   PSA 0.77 10/02/2023   HGBA1C 6.4 04/08/2024   MICROALBUR <0.7 04/08/2024    No results found.  Assessment & Plan:   Primary hypertension- BP is well controlled. -     Basic metabolic panel with GFR; Future -     CBC with Differential/Platelet;  Future -     Hepatic function panel; Future -     Urinalysis, Routine w reflex microscopic; Future -     TSH; Future -     EKG 12-Lead  Hyperlipidemia LDL goal <100- LDL goal achieved. Doing well on the statin  -     Hepatic function panel; Future   Type 2 diabetes mellitus with other circulatory complication, without long-term current use of insulin (HCC)- History of dysrhythmia. Blood  sugar is well controlled. -     Basic metabolic panel with GFR; Future -     Vitamin B12; Future -     Hemoglobin A1c; Future -     Urinalysis, Routine w reflex microscopic; Future -     Microalbumin / creatinine urine ratio; Future -     Bexagliflozin ; Take 1 tablet by mouth every morning.  Dispense: 90 tablet; Refill: 1     Follow-up: Return in about 6 months (around 10/06/2024).  Debby Molt, MD "

## 2024-04-08 NOTE — Patient Instructions (Signed)

## 2024-04-10 NOTE — Addendum Note (Signed)
 Addended by: JOSHUA DEBBY CROME on: 04/10/2024 08:25 AM   Modules accepted: Level of Service

## 2024-10-07 ENCOUNTER — Ambulatory Visit: Admitting: Internal Medicine
